# Patient Record
Sex: Male | Born: 1950
Health system: Southern US, Community
[De-identification: ages and names within clinical notes are randomized; demographics above are authoritative.]

## PROBLEM LIST (undated history)

## (undated) DIAGNOSIS — E785 Hyperlipidemia, unspecified: Secondary | ICD-10-CM

## (undated) DIAGNOSIS — J45909 Unspecified asthma, uncomplicated: Secondary | ICD-10-CM

## (undated) DIAGNOSIS — I10 Essential (primary) hypertension: Secondary | ICD-10-CM

## (undated) DIAGNOSIS — E119 Type 2 diabetes mellitus without complications: Secondary | ICD-10-CM

## (undated) DIAGNOSIS — H409 Unspecified glaucoma: Secondary | ICD-10-CM

## (undated) HISTORY — PX: KNEE SURGERY: SHX244

## (undated) HISTORY — DX: Unspecified glaucoma: H40.9

## (undated) HISTORY — DX: Type 2 diabetes mellitus without complications: E11.9

## (undated) HISTORY — DX: Essential (primary) hypertension: I10

## (undated) HISTORY — DX: Hyperlipidemia, unspecified: E78.5

## (undated) HISTORY — PX: SINUS SURGERY WITH INSTATRAK: SHX5215

## (undated) HISTORY — DX: Unspecified asthma, uncomplicated: J45.909

---

## 2013-12-04 DIAGNOSIS — M1990 Unspecified osteoarthritis, unspecified site: Secondary | ICD-10-CM | POA: Diagnosis not present

## 2013-12-04 DIAGNOSIS — IMO0001 Reserved for inherently not codable concepts without codable children: Secondary | ICD-10-CM | POA: Diagnosis not present

## 2013-12-04 DIAGNOSIS — Z79899 Other long term (current) drug therapy: Secondary | ICD-10-CM | POA: Diagnosis not present

## 2013-12-04 DIAGNOSIS — I10 Essential (primary) hypertension: Secondary | ICD-10-CM | POA: Diagnosis not present

## 2013-12-04 DIAGNOSIS — E7800 Pure hypercholesterolemia, unspecified: Secondary | ICD-10-CM | POA: Diagnosis not present

## 2013-12-05 DIAGNOSIS — B351 Tinea unguium: Secondary | ICD-10-CM | POA: Diagnosis not present

## 2013-12-05 DIAGNOSIS — E119 Type 2 diabetes mellitus without complications: Secondary | ICD-10-CM | POA: Diagnosis not present

## 2013-12-05 DIAGNOSIS — L851 Acquired keratosis [keratoderma] palmaris et plantaris: Secondary | ICD-10-CM | POA: Diagnosis not present

## 2013-12-05 DIAGNOSIS — IMO0002 Reserved for concepts with insufficient information to code with codable children: Secondary | ICD-10-CM | POA: Diagnosis not present

## 2013-12-21 DIAGNOSIS — Z1211 Encounter for screening for malignant neoplasm of colon: Secondary | ICD-10-CM | POA: Diagnosis not present

## 2013-12-21 DIAGNOSIS — E1159 Type 2 diabetes mellitus with other circulatory complications: Secondary | ICD-10-CM | POA: Diagnosis not present

## 2013-12-21 DIAGNOSIS — I10 Essential (primary) hypertension: Secondary | ICD-10-CM | POA: Diagnosis not present

## 2013-12-21 DIAGNOSIS — I798 Other disorders of arteries, arterioles and capillaries in diseases classified elsewhere: Secondary | ICD-10-CM | POA: Diagnosis not present

## 2013-12-21 DIAGNOSIS — D126 Benign neoplasm of colon, unspecified: Secondary | ICD-10-CM | POA: Diagnosis not present

## 2013-12-21 DIAGNOSIS — J45909 Unspecified asthma, uncomplicated: Secondary | ICD-10-CM | POA: Diagnosis not present

## 2013-12-25 DIAGNOSIS — R351 Nocturia: Secondary | ICD-10-CM | POA: Diagnosis not present

## 2013-12-25 DIAGNOSIS — N429 Disorder of prostate, unspecified: Secondary | ICD-10-CM | POA: Diagnosis not present

## 2013-12-25 DIAGNOSIS — N529 Male erectile dysfunction, unspecified: Secondary | ICD-10-CM | POA: Diagnosis not present

## 2014-03-13 DIAGNOSIS — N529 Male erectile dysfunction, unspecified: Secondary | ICD-10-CM | POA: Diagnosis not present

## 2014-03-13 DIAGNOSIS — IMO0001 Reserved for inherently not codable concepts without codable children: Secondary | ICD-10-CM | POA: Diagnosis not present

## 2014-03-13 DIAGNOSIS — I10 Essential (primary) hypertension: Secondary | ICD-10-CM | POA: Diagnosis not present

## 2014-03-13 DIAGNOSIS — E782 Mixed hyperlipidemia: Secondary | ICD-10-CM | POA: Diagnosis not present

## 2014-03-22 ENCOUNTER — Ambulatory Visit (INDEPENDENT_AMBULATORY_CARE_PROVIDER_SITE_OTHER): Payer: Medicare Other | Admitting: Podiatry

## 2014-03-22 ENCOUNTER — Encounter: Payer: Self-pay | Admitting: Podiatry

## 2014-03-22 VITALS — BP 78/43 | HR 42 | Resp 13 | Ht 72.0 in | Wt 248.0 lb

## 2014-03-22 DIAGNOSIS — B351 Tinea unguium: Secondary | ICD-10-CM | POA: Diagnosis not present

## 2014-03-22 DIAGNOSIS — Q828 Other specified congenital malformations of skin: Secondary | ICD-10-CM | POA: Diagnosis not present

## 2014-03-22 DIAGNOSIS — M79609 Pain in unspecified limb: Secondary | ICD-10-CM

## 2014-03-22 DIAGNOSIS — E1159 Type 2 diabetes mellitus with other circulatory complications: Secondary | ICD-10-CM | POA: Diagnosis not present

## 2014-03-22 DIAGNOSIS — M2010 Hallux valgus (acquired), unspecified foot: Secondary | ICD-10-CM | POA: Diagnosis not present

## 2014-03-22 DIAGNOSIS — M79673 Pain in unspecified foot: Secondary | ICD-10-CM

## 2014-03-22 NOTE — Progress Notes (Signed)
   Subjective:    Patient ID: Nathan Decker, male    DOB: [birth year], 63 y.o.   MRN: [id number]  HPI Comments: Pt presents for debridement of 10 toenails and B/L 2-3 MPJ and 5th MPJ plantar callouses.     Review of Systems  Endocrine:       Pt states difficulty managing the diabetes.  Genitourinary:       Hx of hospitalization for UTI  All other systems reviewed and are negative.      Objective:   Physical Exam        Assessment & Plan:

## 2014-03-22 NOTE — Progress Notes (Signed)
Subjective:     Patient ID: Nathan Decker, male   DOB: Apr 06, 1951, 63 y.o.   MRN: [id number]  HPI patient presents stating I have thick nailbeds that I cannot cut myself I did lesions on the bottom of both feet and I have a 15 year history of diabetes   Review of Systems  All other systems reviewed and are negative.      Objective:   Physical Exam  Nursing note and vitals reviewed. Constitutional: He is oriented to person, place, and time.  Cardiovascular: Intact distal pulses.   Musculoskeletal: Normal range of motion.  Neurological: He is oriented to person, place, and time.  Skin: Skin is warm and dry.   neurovascular status is found to be diminished but intact with range of motion subtalar joint midtarsal joint within normal limits and moderate diminishment of muscle strength bilateral. Patient is noted to have thick yellow nailbeds 1-5 both feet that are brittle and painful when pressed and is noted to have keratotic lesion sub-fifth metatarsal of both feet that are moderately tender when pressed especially with excessive ambulation. 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 Debris did nailbeds 1-5 both feet and lesions on both feet with no iatrogenic bleeding noted. Reappoint 3 months and less needs to be seen

## 2014-05-08 DIAGNOSIS — I10 Essential (primary) hypertension: Secondary | ICD-10-CM | POA: Diagnosis not present

## 2014-05-08 DIAGNOSIS — E782 Mixed hyperlipidemia: Secondary | ICD-10-CM | POA: Diagnosis not present

## 2014-05-08 DIAGNOSIS — E1165 Type 2 diabetes mellitus with hyperglycemia: Secondary | ICD-10-CM | POA: Diagnosis not present

## 2014-05-15 DIAGNOSIS — M47816 Spondylosis without myelopathy or radiculopathy, lumbar region: Secondary | ICD-10-CM | POA: Diagnosis not present

## 2014-05-15 DIAGNOSIS — E1165 Type 2 diabetes mellitus with hyperglycemia: Secondary | ICD-10-CM | POA: Diagnosis not present

## 2014-05-15 DIAGNOSIS — M5441 Lumbago with sciatica, right side: Secondary | ICD-10-CM | POA: Diagnosis not present

## 2014-05-15 DIAGNOSIS — E782 Mixed hyperlipidemia: Secondary | ICD-10-CM | POA: Diagnosis not present

## 2014-05-15 DIAGNOSIS — I10 Essential (primary) hypertension: Secondary | ICD-10-CM | POA: Diagnosis not present

## 2014-05-30 DIAGNOSIS — H2512 Age-related nuclear cataract, left eye: Secondary | ICD-10-CM | POA: Diagnosis not present

## 2014-05-30 DIAGNOSIS — H40033 Anatomical narrow angle, bilateral: Secondary | ICD-10-CM | POA: Diagnosis not present

## 2014-05-30 DIAGNOSIS — H524 Presbyopia: Secondary | ICD-10-CM | POA: Diagnosis not present

## 2014-05-30 DIAGNOSIS — H2511 Age-related nuclear cataract, right eye: Secondary | ICD-10-CM | POA: Diagnosis not present

## 2014-05-30 DIAGNOSIS — E119 Type 2 diabetes mellitus without complications: Secondary | ICD-10-CM | POA: Diagnosis not present

## 2014-06-11 ENCOUNTER — Ambulatory Visit (INDEPENDENT_AMBULATORY_CARE_PROVIDER_SITE_OTHER): Payer: Medicare Other | Admitting: Podiatry

## 2014-06-11 DIAGNOSIS — B351 Tinea unguium: Secondary | ICD-10-CM | POA: Diagnosis not present

## 2014-06-11 DIAGNOSIS — M79673 Pain in unspecified foot: Secondary | ICD-10-CM

## 2014-06-11 NOTE — Progress Notes (Signed)
Subjective:     Patient ID: Nathan Decker, male   DOB: [birth year], 63 y.o.   MRN: [id number]  HPI patient presents stating I have thick nailbeds that I cannot cut myself I did lesions on the bottom of both feet and I have a 15 year history of diabetes   Review of Systems  All other systems reviewed and are negative.      Objective:   Physical Exam  Nursing note and vitals reviewed. Constitutional: He is oriented to person, place, and time.  Cardiovascular: Intact distal pulses.   Musculoskeletal: Normal range of motion.  Neurological: He is oriented to person, place, and time.  Skin: Skin is warm and dry.   neurovascular status is found to be diminished but intact with range of motion subtalar joint midtarsal joint within normal limits and moderate diminishment of muscle strength bilateral. Patient is noted to have thick yellow nailbeds 1-5 both feet that are brittle and painful when pressed and is noted to have keratotic lesion sub-fifth metatarsal of both feet that are moderately tender when pressed especially with excessive ambulation. 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 Debris did nailbeds 1-5 both feet and lesions on both feet with no iatrogenic bleeding noted. Reappoint 3 months and less needs to be seen

## 2014-06-12 NOTE — Progress Notes (Signed)
Subjective:     Patient ID: Nathan Decker, male   DOB: 11/22/1950, 63 y.o.   MRN: [id number]  HPI patient continues to experience thick nails on both feet that are painful and he cannot cut himself and history of long-term diabetes   Review of Systems     Objective:   Physical Exam Neurovascular status intact with muscle strength adequate and range of motion subtalar midtarsal joint within normal limits area patient is found to have thick yellow brittle nailbeds 1-5 both feet that are painful when pressed    Assessment:     Mycotic nail infections with pain 1-5 both feet    Plan:     Debride painful nailbeds 1-5 both feet with no iatrogenic bleeding noted      

## 2014-07-03 DIAGNOSIS — H409 Unspecified glaucoma: Secondary | ICD-10-CM | POA: Diagnosis not present

## 2014-07-03 DIAGNOSIS — H4011X1 Primary open-angle glaucoma, mild stage: Secondary | ICD-10-CM | POA: Diagnosis not present

## 2014-07-03 DIAGNOSIS — H402231 Chronic angle-closure glaucoma, bilateral, mild stage: Secondary | ICD-10-CM | POA: Diagnosis not present

## 2014-08-27 ENCOUNTER — Ambulatory Visit (INDEPENDENT_AMBULATORY_CARE_PROVIDER_SITE_OTHER): Payer: Medicare Other | Admitting: Podiatry

## 2014-08-27 DIAGNOSIS — M79673 Pain in unspecified foot: Secondary | ICD-10-CM | POA: Diagnosis not present

## 2014-08-27 DIAGNOSIS — B351 Tinea unguium: Secondary | ICD-10-CM

## 2014-08-27 NOTE — Progress Notes (Signed)
Subjective:     Patient ID: Nathan Decker, male   DOB: 1951/05/04, 64 y.o.   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

## 2014-09-18 DIAGNOSIS — E782 Mixed hyperlipidemia: Secondary | ICD-10-CM | POA: Diagnosis not present

## 2014-09-18 DIAGNOSIS — E1165 Type 2 diabetes mellitus with hyperglycemia: Secondary | ICD-10-CM | POA: Diagnosis not present

## 2014-09-25 DIAGNOSIS — E1165 Type 2 diabetes mellitus with hyperglycemia: Secondary | ICD-10-CM | POA: Diagnosis not present

## 2014-09-25 DIAGNOSIS — I10 Essential (primary) hypertension: Secondary | ICD-10-CM | POA: Diagnosis not present

## 2014-09-25 DIAGNOSIS — E782 Mixed hyperlipidemia: Secondary | ICD-10-CM | POA: Diagnosis not present

## 2014-09-26 DIAGNOSIS — H409 Unspecified glaucoma: Secondary | ICD-10-CM | POA: Diagnosis not present

## 2014-09-26 DIAGNOSIS — H4011X1 Primary open-angle glaucoma, mild stage: Secondary | ICD-10-CM | POA: Diagnosis not present

## 2014-09-26 DIAGNOSIS — H402231 Chronic angle-closure glaucoma, bilateral, mild stage: Secondary | ICD-10-CM | POA: Diagnosis not present

## 2014-11-07 DIAGNOSIS — H4011X1 Primary open-angle glaucoma, mild stage: Secondary | ICD-10-CM | POA: Diagnosis not present

## 2014-11-07 DIAGNOSIS — H409 Unspecified glaucoma: Secondary | ICD-10-CM | POA: Diagnosis not present

## 2014-11-07 DIAGNOSIS — H402231 Chronic angle-closure glaucoma, bilateral, mild stage: Secondary | ICD-10-CM | POA: Diagnosis not present

## 2014-12-10 ENCOUNTER — Ambulatory Visit (INDEPENDENT_AMBULATORY_CARE_PROVIDER_SITE_OTHER): Payer: Medicare Other | Admitting: Podiatry

## 2014-12-10 ENCOUNTER — Encounter: Payer: Self-pay | Admitting: Podiatry

## 2014-12-10 VITALS — BP 178/95 | HR 76 | Resp 16

## 2014-12-10 DIAGNOSIS — M79673 Pain in unspecified foot: Secondary | ICD-10-CM

## 2014-12-10 DIAGNOSIS — B351 Tinea unguium: Secondary | ICD-10-CM

## 2014-12-12 NOTE — Progress Notes (Signed)
Subjective:     Patient ID: Nathan Decker, male   DOB: 21-Jul-1950, 64 y.o.   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

## 2014-12-25 DIAGNOSIS — E782 Mixed hyperlipidemia: Secondary | ICD-10-CM | POA: Diagnosis not present

## 2014-12-25 DIAGNOSIS — E1165 Type 2 diabetes mellitus with hyperglycemia: Secondary | ICD-10-CM | POA: Diagnosis not present

## 2015-01-01 DIAGNOSIS — M542 Cervicalgia: Secondary | ICD-10-CM | POA: Diagnosis not present

## 2015-01-01 DIAGNOSIS — E782 Mixed hyperlipidemia: Secondary | ICD-10-CM | POA: Diagnosis not present

## 2015-01-01 DIAGNOSIS — E1165 Type 2 diabetes mellitus with hyperglycemia: Secondary | ICD-10-CM | POA: Diagnosis not present

## 2015-01-01 DIAGNOSIS — J060 Acute laryngopharyngitis: Secondary | ICD-10-CM | POA: Diagnosis not present

## 2015-01-09 DIAGNOSIS — H402231 Chronic angle-closure glaucoma, bilateral, mild stage: Secondary | ICD-10-CM | POA: Diagnosis not present

## 2015-01-09 DIAGNOSIS — H4011X1 Primary open-angle glaucoma, mild stage: Secondary | ICD-10-CM | POA: Diagnosis not present

## 2015-01-24 DIAGNOSIS — H402231 Chronic angle-closure glaucoma, bilateral, mild stage: Secondary | ICD-10-CM | POA: Diagnosis not present

## 2015-01-24 DIAGNOSIS — H409 Unspecified glaucoma: Secondary | ICD-10-CM | POA: Diagnosis not present

## 2015-01-24 DIAGNOSIS — H4011X1 Primary open-angle glaucoma, mild stage: Secondary | ICD-10-CM | POA: Diagnosis not present

## 2015-04-03 DIAGNOSIS — R490 Dysphonia: Secondary | ICD-10-CM | POA: Diagnosis not present

## 2015-04-03 DIAGNOSIS — J381 Polyp of vocal cord and larynx: Secondary | ICD-10-CM | POA: Diagnosis not present

## 2015-04-22 DIAGNOSIS — J381 Polyp of vocal cord and larynx: Secondary | ICD-10-CM | POA: Diagnosis not present

## 2015-04-22 DIAGNOSIS — E785 Hyperlipidemia, unspecified: Secondary | ICD-10-CM | POA: Diagnosis not present

## 2015-04-22 DIAGNOSIS — Z888 Allergy status to other drugs, medicaments and biological substances status: Secondary | ICD-10-CM | POA: Diagnosis not present

## 2015-04-22 DIAGNOSIS — Z7984 Long term (current) use of oral hypoglycemic drugs: Secondary | ICD-10-CM | POA: Diagnosis not present

## 2015-04-22 DIAGNOSIS — I10 Essential (primary) hypertension: Secondary | ICD-10-CM | POA: Diagnosis not present

## 2015-04-22 DIAGNOSIS — J45909 Unspecified asthma, uncomplicated: Secondary | ICD-10-CM | POA: Diagnosis not present

## 2015-04-22 DIAGNOSIS — Z791 Long term (current) use of non-steroidal anti-inflammatories (NSAID): Secondary | ICD-10-CM | POA: Diagnosis not present

## 2015-04-22 DIAGNOSIS — J382 Nodules of vocal cords: Secondary | ICD-10-CM | POA: Diagnosis not present

## 2015-04-22 DIAGNOSIS — Z79899 Other long term (current) drug therapy: Secondary | ICD-10-CM | POA: Diagnosis not present

## 2015-04-22 DIAGNOSIS — M1990 Unspecified osteoarthritis, unspecified site: Secondary | ICD-10-CM | POA: Diagnosis not present

## 2015-04-22 DIAGNOSIS — E669 Obesity, unspecified: Secondary | ICD-10-CM | POA: Diagnosis not present

## 2015-04-22 DIAGNOSIS — Z87891 Personal history of nicotine dependence: Secondary | ICD-10-CM | POA: Diagnosis not present

## 2015-04-22 DIAGNOSIS — Z881 Allergy status to other antibiotic agents status: Secondary | ICD-10-CM | POA: Diagnosis not present

## 2015-04-22 DIAGNOSIS — E119 Type 2 diabetes mellitus without complications: Secondary | ICD-10-CM | POA: Diagnosis not present

## 2015-04-22 DIAGNOSIS — R490 Dysphonia: Secondary | ICD-10-CM | POA: Diagnosis not present

## 2015-04-22 DIAGNOSIS — Z7982 Long term (current) use of aspirin: Secondary | ICD-10-CM | POA: Diagnosis not present

## 2015-05-06 DIAGNOSIS — J381 Polyp of vocal cord and larynx: Secondary | ICD-10-CM | POA: Diagnosis not present

## 2015-05-29 DIAGNOSIS — E1165 Type 2 diabetes mellitus with hyperglycemia: Secondary | ICD-10-CM | POA: Diagnosis not present

## 2015-05-29 DIAGNOSIS — I10 Essential (primary) hypertension: Secondary | ICD-10-CM | POA: Diagnosis not present

## 2015-05-29 DIAGNOSIS — E782 Mixed hyperlipidemia: Secondary | ICD-10-CM | POA: Diagnosis not present

## 2015-05-29 DIAGNOSIS — N529 Male erectile dysfunction, unspecified: Secondary | ICD-10-CM | POA: Diagnosis not present

## 2015-06-03 DIAGNOSIS — E119 Type 2 diabetes mellitus without complications: Secondary | ICD-10-CM | POA: Diagnosis not present

## 2015-06-03 DIAGNOSIS — H402231 Chronic angle-closure glaucoma, bilateral, mild stage: Secondary | ICD-10-CM | POA: Diagnosis not present

## 2015-06-03 DIAGNOSIS — H401131 Primary open-angle glaucoma, bilateral, mild stage: Secondary | ICD-10-CM | POA: Diagnosis not present

## 2015-06-03 DIAGNOSIS — H25013 Cortical age-related cataract, bilateral: Secondary | ICD-10-CM | POA: Diagnosis not present

## 2015-06-05 DIAGNOSIS — I10 Essential (primary) hypertension: Secondary | ICD-10-CM | POA: Diagnosis not present

## 2015-06-05 DIAGNOSIS — Z23 Encounter for immunization: Secondary | ICD-10-CM | POA: Diagnosis not present

## 2015-06-05 DIAGNOSIS — E782 Mixed hyperlipidemia: Secondary | ICD-10-CM | POA: Diagnosis not present

## 2015-06-05 DIAGNOSIS — E1165 Type 2 diabetes mellitus with hyperglycemia: Secondary | ICD-10-CM | POA: Diagnosis not present

## 2015-06-05 DIAGNOSIS — N529 Male erectile dysfunction, unspecified: Secondary | ICD-10-CM | POA: Diagnosis not present

## 2015-06-24 DIAGNOSIS — J387 Other diseases of larynx: Secondary | ICD-10-CM | POA: Diagnosis not present

## 2015-06-24 DIAGNOSIS — R490 Dysphonia: Secondary | ICD-10-CM | POA: Diagnosis not present

## 2015-07-31 DIAGNOSIS — E1165 Type 2 diabetes mellitus with hyperglycemia: Secondary | ICD-10-CM | POA: Diagnosis not present

## 2015-07-31 DIAGNOSIS — I10 Essential (primary) hypertension: Secondary | ICD-10-CM | POA: Diagnosis not present

## 2015-07-31 DIAGNOSIS — E782 Mixed hyperlipidemia: Secondary | ICD-10-CM | POA: Diagnosis not present

## 2015-09-17 DIAGNOSIS — E1159 Type 2 diabetes mellitus with other circulatory complications: Secondary | ICD-10-CM | POA: Diagnosis not present

## 2015-09-17 DIAGNOSIS — L600 Ingrowing nail: Secondary | ICD-10-CM | POA: Diagnosis not present

## 2015-09-17 DIAGNOSIS — B351 Tinea unguium: Secondary | ICD-10-CM | POA: Diagnosis not present

## 2015-09-17 DIAGNOSIS — L84 Corns and callosities: Secondary | ICD-10-CM | POA: Diagnosis not present

## 2015-09-25 DIAGNOSIS — E1165 Type 2 diabetes mellitus with hyperglycemia: Secondary | ICD-10-CM | POA: Diagnosis not present

## 2015-09-25 DIAGNOSIS — E782 Mixed hyperlipidemia: Secondary | ICD-10-CM | POA: Diagnosis not present

## 2015-09-25 DIAGNOSIS — I10 Essential (primary) hypertension: Secondary | ICD-10-CM | POA: Diagnosis not present

## 2015-10-03 DIAGNOSIS — I10 Essential (primary) hypertension: Secondary | ICD-10-CM | POA: Diagnosis not present

## 2015-10-03 DIAGNOSIS — E782 Mixed hyperlipidemia: Secondary | ICD-10-CM | POA: Diagnosis not present

## 2015-10-03 DIAGNOSIS — E1165 Type 2 diabetes mellitus with hyperglycemia: Secondary | ICD-10-CM | POA: Diagnosis not present

## 2015-10-14 DIAGNOSIS — H402231 Chronic angle-closure glaucoma, bilateral, mild stage: Secondary | ICD-10-CM | POA: Diagnosis not present

## 2015-10-14 DIAGNOSIS — H40053 Ocular hypertension, bilateral: Secondary | ICD-10-CM | POA: Diagnosis not present

## 2015-10-14 DIAGNOSIS — H401131 Primary open-angle glaucoma, bilateral, mild stage: Secondary | ICD-10-CM | POA: Diagnosis not present

## 2015-11-07 DIAGNOSIS — R0981 Nasal congestion: Secondary | ICD-10-CM | POA: Diagnosis not present

## 2015-11-07 DIAGNOSIS — R42 Dizziness and giddiness: Secondary | ICD-10-CM | POA: Diagnosis not present

## 2016-01-15 DIAGNOSIS — E1159 Type 2 diabetes mellitus with other circulatory complications: Secondary | ICD-10-CM | POA: Diagnosis not present

## 2016-01-15 DIAGNOSIS — M2012 Hallux valgus (acquired), left foot: Secondary | ICD-10-CM | POA: Diagnosis not present

## 2016-01-30 DIAGNOSIS — M2012 Hallux valgus (acquired), left foot: Secondary | ICD-10-CM | POA: Diagnosis not present

## 2016-01-30 DIAGNOSIS — M2042 Other hammer toe(s) (acquired), left foot: Secondary | ICD-10-CM | POA: Diagnosis not present

## 2016-01-30 DIAGNOSIS — M216X2 Other acquired deformities of left foot: Secondary | ICD-10-CM | POA: Diagnosis not present

## 2016-01-30 DIAGNOSIS — E1159 Type 2 diabetes mellitus with other circulatory complications: Secondary | ICD-10-CM | POA: Diagnosis not present

## 2016-02-11 DIAGNOSIS — N529 Male erectile dysfunction, unspecified: Secondary | ICD-10-CM | POA: Diagnosis not present

## 2016-02-13 DIAGNOSIS — E7800 Pure hypercholesterolemia, unspecified: Secondary | ICD-10-CM | POA: Diagnosis not present

## 2016-02-13 DIAGNOSIS — M205X2 Other deformities of toe(s) (acquired), left foot: Secondary | ICD-10-CM | POA: Diagnosis not present

## 2016-02-13 DIAGNOSIS — M21612 Bunion of left foot: Secondary | ICD-10-CM | POA: Diagnosis not present

## 2016-02-13 DIAGNOSIS — M2012 Hallux valgus (acquired), left foot: Secondary | ICD-10-CM | POA: Diagnosis not present

## 2016-02-13 DIAGNOSIS — M2042 Other hammer toe(s) (acquired), left foot: Secondary | ICD-10-CM | POA: Diagnosis not present

## 2016-02-13 DIAGNOSIS — E1159 Type 2 diabetes mellitus with other circulatory complications: Secondary | ICD-10-CM | POA: Diagnosis not present

## 2016-02-18 DIAGNOSIS — E1159 Type 2 diabetes mellitus with other circulatory complications: Secondary | ICD-10-CM | POA: Diagnosis not present

## 2016-02-18 DIAGNOSIS — M2042 Other hammer toe(s) (acquired), left foot: Secondary | ICD-10-CM | POA: Diagnosis not present

## 2016-02-18 DIAGNOSIS — M2012 Hallux valgus (acquired), left foot: Secondary | ICD-10-CM | POA: Diagnosis not present

## 2016-03-03 ENCOUNTER — Other Ambulatory Visit: Payer: Self-pay

## 2016-03-11 DIAGNOSIS — B351 Tinea unguium: Secondary | ICD-10-CM | POA: Diagnosis not present

## 2016-03-11 DIAGNOSIS — L84 Corns and callosities: Secondary | ICD-10-CM | POA: Diagnosis not present

## 2016-03-11 DIAGNOSIS — E1159 Type 2 diabetes mellitus with other circulatory complications: Secondary | ICD-10-CM | POA: Diagnosis not present

## 2016-03-11 DIAGNOSIS — M2042 Other hammer toe(s) (acquired), left foot: Secondary | ICD-10-CM | POA: Diagnosis not present

## 2016-03-11 DIAGNOSIS — M2012 Hallux valgus (acquired), left foot: Secondary | ICD-10-CM | POA: Diagnosis not present

## 2016-03-16 DIAGNOSIS — I10 Essential (primary) hypertension: Secondary | ICD-10-CM | POA: Diagnosis not present

## 2016-03-16 DIAGNOSIS — E782 Mixed hyperlipidemia: Secondary | ICD-10-CM | POA: Diagnosis not present

## 2016-03-16 DIAGNOSIS — E1165 Type 2 diabetes mellitus with hyperglycemia: Secondary | ICD-10-CM | POA: Diagnosis not present

## 2016-03-23 DIAGNOSIS — E1165 Type 2 diabetes mellitus with hyperglycemia: Secondary | ICD-10-CM | POA: Diagnosis not present

## 2016-03-23 DIAGNOSIS — N529 Male erectile dysfunction, unspecified: Secondary | ICD-10-CM | POA: Diagnosis not present

## 2016-03-23 DIAGNOSIS — E782 Mixed hyperlipidemia: Secondary | ICD-10-CM | POA: Diagnosis not present

## 2016-03-23 DIAGNOSIS — I10 Essential (primary) hypertension: Secondary | ICD-10-CM | POA: Diagnosis not present

## 2016-04-08 DIAGNOSIS — M2042 Other hammer toe(s) (acquired), left foot: Secondary | ICD-10-CM | POA: Diagnosis not present

## 2016-04-08 DIAGNOSIS — M2012 Hallux valgus (acquired), left foot: Secondary | ICD-10-CM | POA: Diagnosis not present

## 2016-04-08 DIAGNOSIS — E1159 Type 2 diabetes mellitus with other circulatory complications: Secondary | ICD-10-CM | POA: Diagnosis not present

## 2016-04-08 DIAGNOSIS — M216X2 Other acquired deformities of left foot: Secondary | ICD-10-CM | POA: Diagnosis not present

## 2016-04-27 DIAGNOSIS — H401131 Primary open-angle glaucoma, bilateral, mild stage: Secondary | ICD-10-CM | POA: Diagnosis not present

## 2016-04-27 DIAGNOSIS — H35729 Serous detachment of retinal pigment epithelium, unspecified eye: Secondary | ICD-10-CM | POA: Diagnosis not present

## 2016-04-27 DIAGNOSIS — E119 Type 2 diabetes mellitus without complications: Secondary | ICD-10-CM | POA: Diagnosis not present

## 2016-04-27 DIAGNOSIS — H402231 Chronic angle-closure glaucoma, bilateral, mild stage: Secondary | ICD-10-CM | POA: Diagnosis not present

## 2016-04-29 DIAGNOSIS — I10 Essential (primary) hypertension: Secondary | ICD-10-CM | POA: Diagnosis not present

## 2016-04-29 DIAGNOSIS — J45909 Unspecified asthma, uncomplicated: Secondary | ICD-10-CM | POA: Diagnosis not present

## 2016-04-29 DIAGNOSIS — I4510 Unspecified right bundle-branch block: Secondary | ICD-10-CM | POA: Diagnosis not present

## 2016-04-29 DIAGNOSIS — Z87891 Personal history of nicotine dependence: Secondary | ICD-10-CM | POA: Diagnosis not present

## 2016-04-29 DIAGNOSIS — Z888 Allergy status to other drugs, medicaments and biological substances status: Secondary | ICD-10-CM | POA: Diagnosis not present

## 2016-04-29 DIAGNOSIS — Z7982 Long term (current) use of aspirin: Secondary | ICD-10-CM | POA: Diagnosis not present

## 2016-04-29 DIAGNOSIS — R079 Chest pain, unspecified: Secondary | ICD-10-CM | POA: Diagnosis not present

## 2016-04-29 DIAGNOSIS — E1165 Type 2 diabetes mellitus with hyperglycemia: Secondary | ICD-10-CM | POA: Diagnosis not present

## 2016-04-29 DIAGNOSIS — R072 Precordial pain: Secondary | ICD-10-CM | POA: Diagnosis not present

## 2016-04-29 DIAGNOSIS — Z823 Family history of stroke: Secondary | ICD-10-CM | POA: Diagnosis not present

## 2016-04-29 DIAGNOSIS — E785 Hyperlipidemia, unspecified: Secondary | ICD-10-CM | POA: Diagnosis not present

## 2016-04-29 DIAGNOSIS — Z79899 Other long term (current) drug therapy: Secondary | ICD-10-CM | POA: Diagnosis not present

## 2016-04-29 DIAGNOSIS — M1990 Unspecified osteoarthritis, unspecified site: Secondary | ICD-10-CM | POA: Diagnosis not present

## 2016-04-29 DIAGNOSIS — Z881 Allergy status to other antibiotic agents status: Secondary | ICD-10-CM | POA: Diagnosis not present

## 2016-04-29 DIAGNOSIS — Z9102 Food additives allergy status: Secondary | ICD-10-CM | POA: Diagnosis not present

## 2016-04-29 DIAGNOSIS — Z7984 Long term (current) use of oral hypoglycemic drugs: Secondary | ICD-10-CM | POA: Diagnosis not present

## 2016-04-29 DIAGNOSIS — R0789 Other chest pain: Secondary | ICD-10-CM | POA: Diagnosis not present

## 2016-04-30 DIAGNOSIS — E1165 Type 2 diabetes mellitus with hyperglycemia: Secondary | ICD-10-CM | POA: Diagnosis not present

## 2016-04-30 DIAGNOSIS — E785 Hyperlipidemia, unspecified: Secondary | ICD-10-CM | POA: Diagnosis not present

## 2016-04-30 DIAGNOSIS — R079 Chest pain, unspecified: Secondary | ICD-10-CM | POA: Diagnosis not present

## 2016-04-30 DIAGNOSIS — I10 Essential (primary) hypertension: Secondary | ICD-10-CM | POA: Diagnosis not present

## 2016-05-07 DIAGNOSIS — E1165 Type 2 diabetes mellitus with hyperglycemia: Secondary | ICD-10-CM | POA: Diagnosis not present

## 2016-05-07 DIAGNOSIS — R072 Precordial pain: Secondary | ICD-10-CM | POA: Diagnosis not present

## 2016-05-07 DIAGNOSIS — E782 Mixed hyperlipidemia: Secondary | ICD-10-CM | POA: Diagnosis not present

## 2016-05-07 DIAGNOSIS — N529 Male erectile dysfunction, unspecified: Secondary | ICD-10-CM | POA: Diagnosis not present

## 2016-05-13 DIAGNOSIS — L84 Corns and callosities: Secondary | ICD-10-CM | POA: Diagnosis not present

## 2016-05-13 DIAGNOSIS — E1159 Type 2 diabetes mellitus with other circulatory complications: Secondary | ICD-10-CM | POA: Diagnosis not present

## 2016-05-13 DIAGNOSIS — B351 Tinea unguium: Secondary | ICD-10-CM | POA: Diagnosis not present

## 2016-06-16 DIAGNOSIS — N529 Male erectile dysfunction, unspecified: Secondary | ICD-10-CM | POA: Diagnosis not present

## 2016-07-15 DIAGNOSIS — L84 Corns and callosities: Secondary | ICD-10-CM | POA: Diagnosis not present

## 2016-07-15 DIAGNOSIS — E1159 Type 2 diabetes mellitus with other circulatory complications: Secondary | ICD-10-CM | POA: Diagnosis not present

## 2016-07-15 DIAGNOSIS — B351 Tinea unguium: Secondary | ICD-10-CM | POA: Diagnosis not present

## 2016-07-20 DIAGNOSIS — E1165 Type 2 diabetes mellitus with hyperglycemia: Secondary | ICD-10-CM | POA: Diagnosis not present

## 2016-07-20 DIAGNOSIS — E782 Mixed hyperlipidemia: Secondary | ICD-10-CM | POA: Diagnosis not present

## 2016-07-20 DIAGNOSIS — Z125 Encounter for screening for malignant neoplasm of prostate: Secondary | ICD-10-CM | POA: Diagnosis not present

## 2016-07-27 DIAGNOSIS — E1165 Type 2 diabetes mellitus with hyperglycemia: Secondary | ICD-10-CM | POA: Diagnosis not present

## 2016-07-27 DIAGNOSIS — I10 Essential (primary) hypertension: Secondary | ICD-10-CM | POA: Diagnosis not present

## 2016-07-27 DIAGNOSIS — E782 Mixed hyperlipidemia: Secondary | ICD-10-CM | POA: Diagnosis not present

## 2016-08-26 DIAGNOSIS — J028 Acute pharyngitis due to other specified organisms: Secondary | ICD-10-CM | POA: Diagnosis not present

## 2016-08-26 DIAGNOSIS — R070 Pain in throat: Secondary | ICD-10-CM | POA: Diagnosis not present

## 2016-08-26 DIAGNOSIS — R0982 Postnasal drip: Secondary | ICD-10-CM | POA: Diagnosis not present

## 2016-08-26 DIAGNOSIS — J302 Other seasonal allergic rhinitis: Secondary | ICD-10-CM | POA: Diagnosis not present

## 2016-09-16 DIAGNOSIS — B351 Tinea unguium: Secondary | ICD-10-CM | POA: Diagnosis not present

## 2016-09-16 DIAGNOSIS — L84 Corns and callosities: Secondary | ICD-10-CM | POA: Diagnosis not present

## 2016-09-16 DIAGNOSIS — E1159 Type 2 diabetes mellitus with other circulatory complications: Secondary | ICD-10-CM | POA: Diagnosis not present

## 2016-10-26 DIAGNOSIS — H35711 Central serous chorioretinopathy, right eye: Secondary | ICD-10-CM | POA: Diagnosis not present

## 2016-10-26 DIAGNOSIS — E119 Type 2 diabetes mellitus without complications: Secondary | ICD-10-CM | POA: Diagnosis not present

## 2016-10-26 DIAGNOSIS — H402231 Chronic angle-closure glaucoma, bilateral, mild stage: Secondary | ICD-10-CM | POA: Diagnosis not present

## 2016-10-26 DIAGNOSIS — H401131 Primary open-angle glaucoma, bilateral, mild stage: Secondary | ICD-10-CM | POA: Diagnosis not present

## 2016-11-10 DIAGNOSIS — E1165 Type 2 diabetes mellitus with hyperglycemia: Secondary | ICD-10-CM | POA: Diagnosis not present

## 2016-11-10 DIAGNOSIS — I10 Essential (primary) hypertension: Secondary | ICD-10-CM | POA: Diagnosis not present

## 2016-11-10 DIAGNOSIS — E782 Mixed hyperlipidemia: Secondary | ICD-10-CM | POA: Diagnosis not present

## 2016-11-18 DIAGNOSIS — B351 Tinea unguium: Secondary | ICD-10-CM | POA: Diagnosis not present

## 2016-11-18 DIAGNOSIS — E1159 Type 2 diabetes mellitus with other circulatory complications: Secondary | ICD-10-CM | POA: Diagnosis not present

## 2016-11-18 DIAGNOSIS — I10 Essential (primary) hypertension: Secondary | ICD-10-CM | POA: Diagnosis not present

## 2016-11-18 DIAGNOSIS — E1165 Type 2 diabetes mellitus with hyperglycemia: Secondary | ICD-10-CM | POA: Diagnosis not present

## 2016-11-18 DIAGNOSIS — L84 Corns and callosities: Secondary | ICD-10-CM | POA: Diagnosis not present

## 2016-11-18 DIAGNOSIS — E782 Mixed hyperlipidemia: Secondary | ICD-10-CM | POA: Diagnosis not present

## 2016-12-15 DIAGNOSIS — N529 Male erectile dysfunction, unspecified: Secondary | ICD-10-CM | POA: Diagnosis not present

## 2017-01-20 DIAGNOSIS — B351 Tinea unguium: Secondary | ICD-10-CM | POA: Diagnosis not present

## 2017-01-20 DIAGNOSIS — L84 Corns and callosities: Secondary | ICD-10-CM | POA: Diagnosis not present

## 2017-01-20 DIAGNOSIS — E1159 Type 2 diabetes mellitus with other circulatory complications: Secondary | ICD-10-CM | POA: Diagnosis not present

## 2017-03-10 DIAGNOSIS — E1165 Type 2 diabetes mellitus with hyperglycemia: Secondary | ICD-10-CM | POA: Diagnosis not present

## 2017-03-17 DIAGNOSIS — E1165 Type 2 diabetes mellitus with hyperglycemia: Secondary | ICD-10-CM | POA: Diagnosis not present

## 2017-03-17 DIAGNOSIS — N529 Male erectile dysfunction, unspecified: Secondary | ICD-10-CM | POA: Diagnosis not present

## 2017-03-17 DIAGNOSIS — Z23 Encounter for immunization: Secondary | ICD-10-CM | POA: Diagnosis not present

## 2017-03-17 DIAGNOSIS — E782 Mixed hyperlipidemia: Secondary | ICD-10-CM | POA: Diagnosis not present

## 2017-03-17 DIAGNOSIS — I10 Essential (primary) hypertension: Secondary | ICD-10-CM | POA: Diagnosis not present

## 2017-03-23 DIAGNOSIS — R200 Anesthesia of skin: Secondary | ICD-10-CM | POA: Diagnosis not present

## 2017-03-23 DIAGNOSIS — I6523 Occlusion and stenosis of bilateral carotid arteries: Secondary | ICD-10-CM | POA: Diagnosis not present

## 2017-03-23 DIAGNOSIS — E785 Hyperlipidemia, unspecified: Secondary | ICD-10-CM | POA: Diagnosis present

## 2017-03-23 DIAGNOSIS — Z7984 Long term (current) use of oral hypoglycemic drugs: Secondary | ICD-10-CM | POA: Diagnosis not present

## 2017-03-23 DIAGNOSIS — I340 Nonrheumatic mitral (valve) insufficiency: Secondary | ICD-10-CM | POA: Diagnosis not present

## 2017-03-23 DIAGNOSIS — I63212 Cerebral infarction due to unspecified occlusion or stenosis of left vertebral arteries: Secondary | ICD-10-CM | POA: Diagnosis not present

## 2017-03-23 DIAGNOSIS — R05 Cough: Secondary | ICD-10-CM | POA: Diagnosis not present

## 2017-03-23 DIAGNOSIS — I10 Essential (primary) hypertension: Secondary | ICD-10-CM | POA: Diagnosis present

## 2017-03-23 DIAGNOSIS — E118 Type 2 diabetes mellitus with unspecified complications: Secondary | ICD-10-CM | POA: Diagnosis not present

## 2017-03-23 DIAGNOSIS — R402413 Glasgow coma scale score 13-15, at hospital admission: Secondary | ICD-10-CM | POA: Diagnosis present

## 2017-03-23 DIAGNOSIS — Z87891 Personal history of nicotine dependence: Secondary | ICD-10-CM | POA: Diagnosis not present

## 2017-03-23 DIAGNOSIS — H409 Unspecified glaucoma: Secondary | ICD-10-CM | POA: Diagnosis present

## 2017-03-23 DIAGNOSIS — R202 Paresthesia of skin: Secondary | ICD-10-CM | POA: Diagnosis not present

## 2017-03-23 DIAGNOSIS — I639 Cerebral infarction, unspecified: Secondary | ICD-10-CM | POA: Diagnosis not present

## 2017-03-23 DIAGNOSIS — I6322 Cerebral infarction due to unspecified occlusion or stenosis of basilar arteries: Secondary | ICD-10-CM | POA: Diagnosis not present

## 2017-03-30 DIAGNOSIS — I10 Essential (primary) hypertension: Secondary | ICD-10-CM | POA: Diagnosis not present

## 2017-03-30 DIAGNOSIS — E782 Mixed hyperlipidemia: Secondary | ICD-10-CM | POA: Diagnosis not present

## 2017-03-30 DIAGNOSIS — I639 Cerebral infarction, unspecified: Secondary | ICD-10-CM | POA: Diagnosis not present

## 2017-03-30 DIAGNOSIS — E1165 Type 2 diabetes mellitus with hyperglycemia: Secondary | ICD-10-CM | POA: Diagnosis not present

## 2017-04-01 DIAGNOSIS — I6340 Cerebral infarction due to embolism of unspecified cerebral artery: Secondary | ICD-10-CM | POA: Diagnosis not present

## 2017-04-01 DIAGNOSIS — E782 Mixed hyperlipidemia: Secondary | ICD-10-CM | POA: Diagnosis not present

## 2017-04-01 DIAGNOSIS — E1165 Type 2 diabetes mellitus with hyperglycemia: Secondary | ICD-10-CM | POA: Diagnosis not present

## 2017-04-01 DIAGNOSIS — I69351 Hemiplegia and hemiparesis following cerebral infarction affecting right dominant side: Secondary | ICD-10-CM | POA: Diagnosis not present

## 2017-04-01 DIAGNOSIS — E1159 Type 2 diabetes mellitus with other circulatory complications: Secondary | ICD-10-CM | POA: Diagnosis not present

## 2017-04-06 DIAGNOSIS — E782 Mixed hyperlipidemia: Secondary | ICD-10-CM | POA: Diagnosis not present

## 2017-04-06 DIAGNOSIS — I63412 Cerebral infarction due to embolism of left middle cerebral artery: Secondary | ICD-10-CM | POA: Diagnosis not present

## 2017-04-06 DIAGNOSIS — E1165 Type 2 diabetes mellitus with hyperglycemia: Secondary | ICD-10-CM | POA: Diagnosis not present

## 2017-04-06 DIAGNOSIS — I69351 Hemiplegia and hemiparesis following cerebral infarction affecting right dominant side: Secondary | ICD-10-CM | POA: Diagnosis not present

## 2017-05-03 DIAGNOSIS — B351 Tinea unguium: Secondary | ICD-10-CM | POA: Diagnosis not present

## 2017-05-03 DIAGNOSIS — E1159 Type 2 diabetes mellitus with other circulatory complications: Secondary | ICD-10-CM | POA: Diagnosis not present

## 2017-05-03 DIAGNOSIS — L84 Corns and callosities: Secondary | ICD-10-CM | POA: Diagnosis not present

## 2017-05-05 DIAGNOSIS — H402231 Chronic angle-closure glaucoma, bilateral, mild stage: Secondary | ICD-10-CM | POA: Diagnosis not present

## 2017-05-05 DIAGNOSIS — E119 Type 2 diabetes mellitus without complications: Secondary | ICD-10-CM | POA: Diagnosis not present

## 2017-05-05 DIAGNOSIS — H25013 Cortical age-related cataract, bilateral: Secondary | ICD-10-CM | POA: Diagnosis not present

## 2017-05-05 DIAGNOSIS — H401131 Primary open-angle glaucoma, bilateral, mild stage: Secondary | ICD-10-CM | POA: Diagnosis not present

## 2017-05-07 DIAGNOSIS — E782 Mixed hyperlipidemia: Secondary | ICD-10-CM | POA: Diagnosis not present

## 2017-05-07 DIAGNOSIS — I63412 Cerebral infarction due to embolism of left middle cerebral artery: Secondary | ICD-10-CM | POA: Diagnosis not present

## 2017-05-07 DIAGNOSIS — E1165 Type 2 diabetes mellitus with hyperglycemia: Secondary | ICD-10-CM | POA: Diagnosis not present

## 2017-05-07 DIAGNOSIS — Z23 Encounter for immunization: Secondary | ICD-10-CM | POA: Diagnosis not present

## 2017-05-07 DIAGNOSIS — E1159 Type 2 diabetes mellitus with other circulatory complications: Secondary | ICD-10-CM | POA: Diagnosis not present

## 2017-07-05 DIAGNOSIS — B351 Tinea unguium: Secondary | ICD-10-CM | POA: Diagnosis not present

## 2017-07-05 DIAGNOSIS — L84 Corns and callosities: Secondary | ICD-10-CM | POA: Diagnosis not present

## 2017-07-05 DIAGNOSIS — E1159 Type 2 diabetes mellitus with other circulatory complications: Secondary | ICD-10-CM | POA: Diagnosis not present

## 2017-07-13 DIAGNOSIS — Z0000 Encounter for general adult medical examination without abnormal findings: Secondary | ICD-10-CM | POA: Diagnosis not present

## 2017-07-13 DIAGNOSIS — E782 Mixed hyperlipidemia: Secondary | ICD-10-CM | POA: Diagnosis not present

## 2017-07-13 DIAGNOSIS — Z125 Encounter for screening for malignant neoplasm of prostate: Secondary | ICD-10-CM | POA: Diagnosis not present

## 2017-07-13 DIAGNOSIS — I10 Essential (primary) hypertension: Secondary | ICD-10-CM | POA: Diagnosis not present

## 2017-07-13 DIAGNOSIS — E1165 Type 2 diabetes mellitus with hyperglycemia: Secondary | ICD-10-CM | POA: Diagnosis not present

## 2017-07-20 DIAGNOSIS — E1159 Type 2 diabetes mellitus with other circulatory complications: Secondary | ICD-10-CM | POA: Diagnosis not present

## 2017-07-20 DIAGNOSIS — I63412 Cerebral infarction due to embolism of left middle cerebral artery: Secondary | ICD-10-CM | POA: Diagnosis not present

## 2017-07-20 DIAGNOSIS — E782 Mixed hyperlipidemia: Secondary | ICD-10-CM | POA: Diagnosis not present

## 2017-07-20 DIAGNOSIS — N529 Male erectile dysfunction, unspecified: Secondary | ICD-10-CM | POA: Diagnosis not present

## 2017-07-20 DIAGNOSIS — I69351 Hemiplegia and hemiparesis following cerebral infarction affecting right dominant side: Secondary | ICD-10-CM | POA: Diagnosis not present

## 2017-07-20 DIAGNOSIS — E1165 Type 2 diabetes mellitus with hyperglycemia: Secondary | ICD-10-CM | POA: Diagnosis not present

## 2017-07-20 DIAGNOSIS — I10 Essential (primary) hypertension: Secondary | ICD-10-CM | POA: Diagnosis not present

## 2017-08-09 DIAGNOSIS — Z125 Encounter for screening for malignant neoplasm of prostate: Secondary | ICD-10-CM | POA: Diagnosis not present

## 2017-08-09 DIAGNOSIS — N5201 Erectile dysfunction due to arterial insufficiency: Secondary | ICD-10-CM | POA: Diagnosis not present

## 2017-09-15 DIAGNOSIS — E1159 Type 2 diabetes mellitus with other circulatory complications: Secondary | ICD-10-CM | POA: Diagnosis not present

## 2017-09-15 DIAGNOSIS — L84 Corns and callosities: Secondary | ICD-10-CM | POA: Diagnosis not present

## 2017-09-15 DIAGNOSIS — B351 Tinea unguium: Secondary | ICD-10-CM | POA: Diagnosis not present

## 2017-11-09 DIAGNOSIS — N5201 Erectile dysfunction due to arterial insufficiency: Secondary | ICD-10-CM | POA: Diagnosis not present

## 2017-11-17 DIAGNOSIS — L84 Corns and callosities: Secondary | ICD-10-CM | POA: Diagnosis not present

## 2017-11-17 DIAGNOSIS — E1159 Type 2 diabetes mellitus with other circulatory complications: Secondary | ICD-10-CM | POA: Diagnosis not present

## 2017-11-17 DIAGNOSIS — B351 Tinea unguium: Secondary | ICD-10-CM | POA: Diagnosis not present

## 2017-11-23 DIAGNOSIS — H402231 Chronic angle-closure glaucoma, bilateral, mild stage: Secondary | ICD-10-CM | POA: Diagnosis not present

## 2017-11-23 DIAGNOSIS — H401131 Primary open-angle glaucoma, bilateral, mild stage: Secondary | ICD-10-CM | POA: Diagnosis not present

## 2017-11-25 DIAGNOSIS — N481 Balanitis: Secondary | ICD-10-CM | POA: Diagnosis not present

## 2017-12-02 DIAGNOSIS — I63412 Cerebral infarction due to embolism of left middle cerebral artery: Secondary | ICD-10-CM | POA: Diagnosis not present

## 2017-12-02 DIAGNOSIS — R202 Paresthesia of skin: Secondary | ICD-10-CM | POA: Diagnosis not present

## 2017-12-17 DIAGNOSIS — E782 Mixed hyperlipidemia: Secondary | ICD-10-CM | POA: Diagnosis not present

## 2017-12-17 DIAGNOSIS — E1165 Type 2 diabetes mellitus with hyperglycemia: Secondary | ICD-10-CM | POA: Diagnosis not present

## 2017-12-17 DIAGNOSIS — I63412 Cerebral infarction due to embolism of left middle cerebral artery: Secondary | ICD-10-CM | POA: Diagnosis not present

## 2017-12-28 DIAGNOSIS — E1165 Type 2 diabetes mellitus with hyperglycemia: Secondary | ICD-10-CM | POA: Diagnosis not present

## 2017-12-28 DIAGNOSIS — E1159 Type 2 diabetes mellitus with other circulatory complications: Secondary | ICD-10-CM | POA: Diagnosis not present

## 2017-12-28 DIAGNOSIS — E782 Mixed hyperlipidemia: Secondary | ICD-10-CM | POA: Diagnosis not present

## 2017-12-28 DIAGNOSIS — I69351 Hemiplegia and hemiparesis following cerebral infarction affecting right dominant side: Secondary | ICD-10-CM | POA: Diagnosis not present

## 2017-12-28 DIAGNOSIS — I63412 Cerebral infarction due to embolism of left middle cerebral artery: Secondary | ICD-10-CM | POA: Diagnosis not present

## 2018-01-19 DIAGNOSIS — E1159 Type 2 diabetes mellitus with other circulatory complications: Secondary | ICD-10-CM | POA: Diagnosis not present

## 2018-01-19 DIAGNOSIS — B351 Tinea unguium: Secondary | ICD-10-CM | POA: Diagnosis not present

## 2018-01-19 DIAGNOSIS — L84 Corns and callosities: Secondary | ICD-10-CM | POA: Diagnosis not present

## 2018-01-25 DIAGNOSIS — M792 Neuralgia and neuritis, unspecified: Secondary | ICD-10-CM | POA: Diagnosis not present

## 2018-01-25 DIAGNOSIS — E782 Mixed hyperlipidemia: Secondary | ICD-10-CM | POA: Diagnosis not present

## 2018-01-25 DIAGNOSIS — E1165 Type 2 diabetes mellitus with hyperglycemia: Secondary | ICD-10-CM | POA: Diagnosis not present

## 2018-01-25 DIAGNOSIS — I69351 Hemiplegia and hemiparesis following cerebral infarction affecting right dominant side: Secondary | ICD-10-CM | POA: Diagnosis not present

## 2018-01-25 DIAGNOSIS — I10 Essential (primary) hypertension: Secondary | ICD-10-CM | POA: Diagnosis not present

## 2018-02-24 DIAGNOSIS — G890 Central pain syndrome: Secondary | ICD-10-CM | POA: Diagnosis not present

## 2018-03-15 DIAGNOSIS — J069 Acute upper respiratory infection, unspecified: Secondary | ICD-10-CM | POA: Diagnosis not present

## 2018-03-15 DIAGNOSIS — B9789 Other viral agents as the cause of diseases classified elsewhere: Secondary | ICD-10-CM | POA: Diagnosis not present

## 2018-03-21 DIAGNOSIS — I491 Atrial premature depolarization: Secondary | ICD-10-CM | POA: Diagnosis not present

## 2018-03-21 DIAGNOSIS — J209 Acute bronchitis, unspecified: Secondary | ICD-10-CM | POA: Diagnosis not present

## 2018-03-21 DIAGNOSIS — J9801 Acute bronchospasm: Secondary | ICD-10-CM | POA: Diagnosis not present

## 2018-03-21 DIAGNOSIS — R0602 Shortness of breath: Secondary | ICD-10-CM | POA: Diagnosis not present

## 2018-03-21 DIAGNOSIS — I4510 Unspecified right bundle-branch block: Secondary | ICD-10-CM | POA: Diagnosis not present

## 2018-03-22 DIAGNOSIS — E1165 Type 2 diabetes mellitus with hyperglycemia: Secondary | ICD-10-CM | POA: Diagnosis not present

## 2018-03-22 DIAGNOSIS — I10 Essential (primary) hypertension: Secondary | ICD-10-CM | POA: Diagnosis not present

## 2018-03-22 DIAGNOSIS — E782 Mixed hyperlipidemia: Secondary | ICD-10-CM | POA: Diagnosis not present

## 2018-03-29 DIAGNOSIS — I69351 Hemiplegia and hemiparesis following cerebral infarction affecting right dominant side: Secondary | ICD-10-CM | POA: Diagnosis not present

## 2018-03-29 DIAGNOSIS — I63412 Cerebral infarction due to embolism of left middle cerebral artery: Secondary | ICD-10-CM | POA: Diagnosis not present

## 2018-03-29 DIAGNOSIS — M792 Neuralgia and neuritis, unspecified: Secondary | ICD-10-CM | POA: Diagnosis not present

## 2018-03-29 DIAGNOSIS — E1165 Type 2 diabetes mellitus with hyperglycemia: Secondary | ICD-10-CM | POA: Diagnosis not present

## 2018-03-29 DIAGNOSIS — I10 Essential (primary) hypertension: Secondary | ICD-10-CM | POA: Diagnosis not present

## 2018-03-29 DIAGNOSIS — Z23 Encounter for immunization: Secondary | ICD-10-CM | POA: Diagnosis not present

## 2018-03-29 DIAGNOSIS — R202 Paresthesia of skin: Secondary | ICD-10-CM | POA: Diagnosis not present

## 2018-03-30 DIAGNOSIS — E1159 Type 2 diabetes mellitus with other circulatory complications: Secondary | ICD-10-CM | POA: Diagnosis not present

## 2018-03-30 DIAGNOSIS — L84 Corns and callosities: Secondary | ICD-10-CM | POA: Diagnosis not present

## 2018-03-30 DIAGNOSIS — B351 Tinea unguium: Secondary | ICD-10-CM | POA: Diagnosis not present

## 2018-04-05 DIAGNOSIS — Z1331 Encounter for screening for depression: Secondary | ICD-10-CM | POA: Diagnosis not present

## 2018-04-05 DIAGNOSIS — R200 Anesthesia of skin: Secondary | ICD-10-CM | POA: Diagnosis not present

## 2018-04-05 DIAGNOSIS — G8929 Other chronic pain: Secondary | ICD-10-CM | POA: Diagnosis not present

## 2018-04-05 DIAGNOSIS — Z79899 Other long term (current) drug therapy: Secondary | ICD-10-CM | POA: Diagnosis not present

## 2018-04-05 DIAGNOSIS — M25562 Pain in left knee: Secondary | ICD-10-CM | POA: Diagnosis not present

## 2018-04-05 DIAGNOSIS — Z1339 Encounter for screening examination for other mental health and behavioral disorders: Secondary | ICD-10-CM | POA: Diagnosis not present

## 2018-04-05 DIAGNOSIS — M25561 Pain in right knee: Secondary | ICD-10-CM | POA: Diagnosis not present

## 2018-05-03 DIAGNOSIS — M25561 Pain in right knee: Secondary | ICD-10-CM | POA: Diagnosis not present

## 2018-05-03 DIAGNOSIS — G8929 Other chronic pain: Secondary | ICD-10-CM | POA: Diagnosis not present

## 2018-05-03 DIAGNOSIS — M25562 Pain in left knee: Secondary | ICD-10-CM | POA: Diagnosis not present

## 2018-05-03 DIAGNOSIS — R200 Anesthesia of skin: Secondary | ICD-10-CM | POA: Diagnosis not present

## 2018-05-03 DIAGNOSIS — Z79899 Other long term (current) drug therapy: Secondary | ICD-10-CM | POA: Diagnosis not present

## 2018-05-10 DIAGNOSIS — Z125 Encounter for screening for malignant neoplasm of prostate: Secondary | ICD-10-CM | POA: Diagnosis not present

## 2018-05-17 DIAGNOSIS — N5201 Erectile dysfunction due to arterial insufficiency: Secondary | ICD-10-CM | POA: Diagnosis not present

## 2018-05-25 DIAGNOSIS — M1711 Unilateral primary osteoarthritis, right knee: Secondary | ICD-10-CM | POA: Diagnosis not present

## 2018-05-25 DIAGNOSIS — M25561 Pain in right knee: Secondary | ICD-10-CM | POA: Diagnosis not present

## 2018-05-25 DIAGNOSIS — M1712 Unilateral primary osteoarthritis, left knee: Secondary | ICD-10-CM | POA: Diagnosis not present

## 2018-05-25 DIAGNOSIS — M25562 Pain in left knee: Secondary | ICD-10-CM | POA: Diagnosis not present

## 2018-05-31 DIAGNOSIS — E119 Type 2 diabetes mellitus without complications: Secondary | ICD-10-CM | POA: Diagnosis not present

## 2018-05-31 DIAGNOSIS — H402231 Chronic angle-closure glaucoma, bilateral, mild stage: Secondary | ICD-10-CM | POA: Diagnosis not present

## 2018-05-31 DIAGNOSIS — H401131 Primary open-angle glaucoma, bilateral, mild stage: Secondary | ICD-10-CM | POA: Diagnosis not present

## 2018-05-31 DIAGNOSIS — H25013 Cortical age-related cataract, bilateral: Secondary | ICD-10-CM | POA: Diagnosis not present

## 2018-06-01 DIAGNOSIS — E1159 Type 2 diabetes mellitus with other circulatory complications: Secondary | ICD-10-CM | POA: Diagnosis not present

## 2018-06-01 DIAGNOSIS — L84 Corns and callosities: Secondary | ICD-10-CM | POA: Diagnosis not present

## 2018-06-01 DIAGNOSIS — B351 Tinea unguium: Secondary | ICD-10-CM | POA: Diagnosis not present

## 2018-06-08 DIAGNOSIS — M1711 Unilateral primary osteoarthritis, right knee: Secondary | ICD-10-CM | POA: Diagnosis not present

## 2018-08-02 DIAGNOSIS — I69351 Hemiplegia and hemiparesis following cerebral infarction affecting right dominant side: Secondary | ICD-10-CM | POA: Diagnosis not present

## 2018-08-02 DIAGNOSIS — E1165 Type 2 diabetes mellitus with hyperglycemia: Secondary | ICD-10-CM | POA: Diagnosis not present

## 2018-08-02 DIAGNOSIS — I10 Essential (primary) hypertension: Secondary | ICD-10-CM | POA: Diagnosis not present

## 2018-08-02 DIAGNOSIS — Z0000 Encounter for general adult medical examination without abnormal findings: Secondary | ICD-10-CM | POA: Diagnosis not present

## 2018-08-02 DIAGNOSIS — I63412 Cerebral infarction due to embolism of left middle cerebral artery: Secondary | ICD-10-CM | POA: Diagnosis not present

## 2018-08-03 DIAGNOSIS — L84 Corns and callosities: Secondary | ICD-10-CM | POA: Diagnosis not present

## 2018-08-03 DIAGNOSIS — E1159 Type 2 diabetes mellitus with other circulatory complications: Secondary | ICD-10-CM | POA: Diagnosis not present

## 2018-08-03 DIAGNOSIS — B351 Tinea unguium: Secondary | ICD-10-CM | POA: Diagnosis not present

## 2018-08-09 DIAGNOSIS — I63412 Cerebral infarction due to embolism of left middle cerebral artery: Secondary | ICD-10-CM | POA: Diagnosis not present

## 2018-08-09 DIAGNOSIS — E1159 Type 2 diabetes mellitus with other circulatory complications: Secondary | ICD-10-CM | POA: Diagnosis not present

## 2018-08-09 DIAGNOSIS — E1165 Type 2 diabetes mellitus with hyperglycemia: Secondary | ICD-10-CM | POA: Diagnosis not present

## 2018-08-09 DIAGNOSIS — Z0000 Encounter for general adult medical examination without abnormal findings: Secondary | ICD-10-CM | POA: Diagnosis not present

## 2018-08-11 DIAGNOSIS — R112 Nausea with vomiting, unspecified: Secondary | ICD-10-CM | POA: Diagnosis not present

## 2018-08-11 DIAGNOSIS — Z7984 Long term (current) use of oral hypoglycemic drugs: Secondary | ICD-10-CM | POA: Diagnosis not present

## 2018-08-11 DIAGNOSIS — R110 Nausea: Secondary | ICD-10-CM | POA: Diagnosis not present

## 2018-08-11 DIAGNOSIS — Z888 Allergy status to other drugs, medicaments and biological substances status: Secondary | ICD-10-CM | POA: Diagnosis not present

## 2018-08-11 DIAGNOSIS — E11618 Type 2 diabetes mellitus with other diabetic arthropathy: Secondary | ICD-10-CM | POA: Diagnosis not present

## 2018-08-11 DIAGNOSIS — Z881 Allergy status to other antibiotic agents status: Secondary | ICD-10-CM | POA: Diagnosis not present

## 2018-08-11 DIAGNOSIS — Z79899 Other long term (current) drug therapy: Secondary | ICD-10-CM | POA: Diagnosis not present

## 2018-08-11 DIAGNOSIS — R1111 Vomiting without nausea: Secondary | ICD-10-CM | POA: Diagnosis not present

## 2018-08-11 DIAGNOSIS — I4510 Unspecified right bundle-branch block: Secondary | ICD-10-CM | POA: Diagnosis not present

## 2018-08-11 DIAGNOSIS — E785 Hyperlipidemia, unspecified: Secondary | ICD-10-CM | POA: Diagnosis not present

## 2018-08-11 DIAGNOSIS — Z8673 Personal history of transient ischemic attack (TIA), and cerebral infarction without residual deficits: Secondary | ICD-10-CM | POA: Diagnosis not present

## 2018-08-11 DIAGNOSIS — I10 Essential (primary) hypertension: Secondary | ICD-10-CM | POA: Diagnosis not present

## 2018-08-11 DIAGNOSIS — Z7982 Long term (current) use of aspirin: Secondary | ICD-10-CM | POA: Diagnosis not present

## 2018-08-11 DIAGNOSIS — J45909 Unspecified asthma, uncomplicated: Secondary | ICD-10-CM | POA: Diagnosis not present

## 2018-08-11 DIAGNOSIS — K2900 Acute gastritis without bleeding: Secondary | ICD-10-CM | POA: Diagnosis not present

## 2018-08-11 DIAGNOSIS — Z87891 Personal history of nicotine dependence: Secondary | ICD-10-CM | POA: Diagnosis not present

## 2018-08-16 DIAGNOSIS — I69351 Hemiplegia and hemiparesis following cerebral infarction affecting right dominant side: Secondary | ICD-10-CM | POA: Diagnosis not present

## 2018-08-16 DIAGNOSIS — E1165 Type 2 diabetes mellitus with hyperglycemia: Secondary | ICD-10-CM | POA: Diagnosis not present

## 2018-08-16 DIAGNOSIS — E782 Mixed hyperlipidemia: Secondary | ICD-10-CM | POA: Diagnosis not present

## 2018-08-16 DIAGNOSIS — E118 Type 2 diabetes mellitus with unspecified complications: Secondary | ICD-10-CM | POA: Diagnosis not present

## 2018-08-16 DIAGNOSIS — I10 Essential (primary) hypertension: Secondary | ICD-10-CM | POA: Diagnosis not present

## 2018-08-30 DIAGNOSIS — E1165 Type 2 diabetes mellitus with hyperglycemia: Secondary | ICD-10-CM | POA: Diagnosis not present

## 2018-08-30 DIAGNOSIS — E118 Type 2 diabetes mellitus with unspecified complications: Secondary | ICD-10-CM | POA: Diagnosis not present

## 2018-08-30 DIAGNOSIS — I69351 Hemiplegia and hemiparesis following cerebral infarction affecting right dominant side: Secondary | ICD-10-CM | POA: Diagnosis not present

## 2018-08-30 DIAGNOSIS — E782 Mixed hyperlipidemia: Secondary | ICD-10-CM | POA: Diagnosis not present

## 2018-08-30 DIAGNOSIS — I10 Essential (primary) hypertension: Secondary | ICD-10-CM | POA: Diagnosis not present

## 2018-09-13 DIAGNOSIS — E1165 Type 2 diabetes mellitus with hyperglycemia: Secondary | ICD-10-CM | POA: Diagnosis not present

## 2019-01-24 DIAGNOSIS — E1165 Type 2 diabetes mellitus with hyperglycemia: Secondary | ICD-10-CM | POA: Diagnosis not present

## 2019-01-24 DIAGNOSIS — Z7189 Other specified counseling: Secondary | ICD-10-CM | POA: Diagnosis not present

## 2019-01-24 DIAGNOSIS — I63412 Cerebral infarction due to embolism of left middle cerebral artery: Secondary | ICD-10-CM | POA: Diagnosis not present

## 2019-01-24 DIAGNOSIS — I69351 Hemiplegia and hemiparesis following cerebral infarction affecting right dominant side: Secondary | ICD-10-CM | POA: Diagnosis not present

## 2019-01-24 DIAGNOSIS — F5101 Primary insomnia: Secondary | ICD-10-CM | POA: Diagnosis not present

## 2019-01-26 DIAGNOSIS — E1165 Type 2 diabetes mellitus with hyperglycemia: Secondary | ICD-10-CM | POA: Diagnosis not present

## 2019-01-26 DIAGNOSIS — E782 Mixed hyperlipidemia: Secondary | ICD-10-CM | POA: Diagnosis not present

## 2019-01-26 DIAGNOSIS — E118 Type 2 diabetes mellitus with unspecified complications: Secondary | ICD-10-CM | POA: Diagnosis not present

## 2019-01-26 DIAGNOSIS — I10 Essential (primary) hypertension: Secondary | ICD-10-CM | POA: Diagnosis not present

## 2019-01-26 DIAGNOSIS — I69351 Hemiplegia and hemiparesis following cerebral infarction affecting right dominant side: Secondary | ICD-10-CM | POA: Diagnosis not present

## 2019-02-21 DIAGNOSIS — E1165 Type 2 diabetes mellitus with hyperglycemia: Secondary | ICD-10-CM | POA: Diagnosis not present

## 2019-02-21 DIAGNOSIS — I69351 Hemiplegia and hemiparesis following cerebral infarction affecting right dominant side: Secondary | ICD-10-CM | POA: Diagnosis not present

## 2019-02-21 DIAGNOSIS — E782 Mixed hyperlipidemia: Secondary | ICD-10-CM | POA: Diagnosis not present

## 2019-02-21 DIAGNOSIS — H401131 Primary open-angle glaucoma, bilateral, mild stage: Secondary | ICD-10-CM | POA: Diagnosis not present

## 2019-02-23 DIAGNOSIS — Z8673 Personal history of transient ischemic attack (TIA), and cerebral infarction without residual deficits: Secondary | ICD-10-CM | POA: Diagnosis not present

## 2019-02-23 DIAGNOSIS — E1165 Type 2 diabetes mellitus with hyperglycemia: Secondary | ICD-10-CM | POA: Diagnosis not present

## 2019-02-23 DIAGNOSIS — R1084 Generalized abdominal pain: Secondary | ICD-10-CM | POA: Diagnosis not present

## 2019-02-23 DIAGNOSIS — R51 Headache: Secondary | ICD-10-CM | POA: Diagnosis not present

## 2019-02-28 DIAGNOSIS — N182 Chronic kidney disease, stage 2 (mild): Secondary | ICD-10-CM | POA: Diagnosis not present

## 2019-02-28 DIAGNOSIS — I63412 Cerebral infarction due to embolism of left middle cerebral artery: Secondary | ICD-10-CM | POA: Diagnosis not present

## 2019-02-28 DIAGNOSIS — Z23 Encounter for immunization: Secondary | ICD-10-CM | POA: Diagnosis not present

## 2019-02-28 DIAGNOSIS — E1165 Type 2 diabetes mellitus with hyperglycemia: Secondary | ICD-10-CM | POA: Diagnosis not present

## 2019-02-28 DIAGNOSIS — I69351 Hemiplegia and hemiparesis following cerebral infarction affecting right dominant side: Secondary | ICD-10-CM | POA: Diagnosis not present

## 2019-02-28 DIAGNOSIS — I129 Hypertensive chronic kidney disease with stage 1 through stage 4 chronic kidney disease, or unspecified chronic kidney disease: Secondary | ICD-10-CM | POA: Diagnosis not present

## 2019-04-17 DIAGNOSIS — L84 Corns and callosities: Secondary | ICD-10-CM | POA: Diagnosis not present

## 2019-04-17 DIAGNOSIS — B351 Tinea unguium: Secondary | ICD-10-CM | POA: Diagnosis not present

## 2019-04-17 DIAGNOSIS — E1159 Type 2 diabetes mellitus with other circulatory complications: Secondary | ICD-10-CM | POA: Diagnosis not present

## 2019-05-30 DIAGNOSIS — E782 Mixed hyperlipidemia: Secondary | ICD-10-CM | POA: Diagnosis not present

## 2019-05-30 DIAGNOSIS — I10 Essential (primary) hypertension: Secondary | ICD-10-CM | POA: Diagnosis not present

## 2019-05-30 DIAGNOSIS — E1165 Type 2 diabetes mellitus with hyperglycemia: Secondary | ICD-10-CM | POA: Diagnosis not present

## 2019-05-30 DIAGNOSIS — I69351 Hemiplegia and hemiparesis following cerebral infarction affecting right dominant side: Secondary | ICD-10-CM | POA: Diagnosis not present

## 2019-06-13 DIAGNOSIS — N182 Chronic kidney disease, stage 2 (mild): Secondary | ICD-10-CM | POA: Diagnosis not present

## 2019-06-13 DIAGNOSIS — I129 Hypertensive chronic kidney disease with stage 1 through stage 4 chronic kidney disease, or unspecified chronic kidney disease: Secondary | ICD-10-CM | POA: Diagnosis not present

## 2019-06-20 DIAGNOSIS — E1159 Type 2 diabetes mellitus with other circulatory complications: Secondary | ICD-10-CM | POA: Diagnosis not present

## 2019-06-20 DIAGNOSIS — E782 Mixed hyperlipidemia: Secondary | ICD-10-CM | POA: Diagnosis not present

## 2019-06-20 DIAGNOSIS — E1165 Type 2 diabetes mellitus with hyperglycemia: Secondary | ICD-10-CM | POA: Diagnosis not present

## 2019-06-20 DIAGNOSIS — I63412 Cerebral infarction due to embolism of left middle cerebral artery: Secondary | ICD-10-CM | POA: Diagnosis not present

## 2019-06-20 DIAGNOSIS — I69351 Hemiplegia and hemiparesis following cerebral infarction affecting right dominant side: Secondary | ICD-10-CM | POA: Diagnosis not present

## 2019-07-17 DIAGNOSIS — B351 Tinea unguium: Secondary | ICD-10-CM | POA: Diagnosis not present

## 2019-07-17 DIAGNOSIS — L84 Corns and callosities: Secondary | ICD-10-CM | POA: Diagnosis not present

## 2019-07-17 DIAGNOSIS — E1159 Type 2 diabetes mellitus with other circulatory complications: Secondary | ICD-10-CM | POA: Diagnosis not present

## 2019-07-18 DIAGNOSIS — H401131 Primary open-angle glaucoma, bilateral, mild stage: Secondary | ICD-10-CM | POA: Diagnosis not present

## 2019-07-18 DIAGNOSIS — L723 Sebaceous cyst: Secondary | ICD-10-CM | POA: Diagnosis not present

## 2019-08-11 DIAGNOSIS — L0100 Impetigo, unspecified: Secondary | ICD-10-CM | POA: Diagnosis not present

## 2019-08-11 DIAGNOSIS — L578 Other skin changes due to chronic exposure to nonionizing radiation: Secondary | ICD-10-CM | POA: Diagnosis not present

## 2019-08-11 DIAGNOSIS — L720 Epidermal cyst: Secondary | ICD-10-CM | POA: Diagnosis not present

## 2019-08-28 DIAGNOSIS — H401131 Primary open-angle glaucoma, bilateral, mild stage: Secondary | ICD-10-CM | POA: Diagnosis not present

## 2019-09-20 DIAGNOSIS — I69351 Hemiplegia and hemiparesis following cerebral infarction affecting right dominant side: Secondary | ICD-10-CM | POA: Diagnosis not present

## 2019-09-20 DIAGNOSIS — R202 Paresthesia of skin: Secondary | ICD-10-CM | POA: Diagnosis not present

## 2019-09-20 DIAGNOSIS — M792 Neuralgia and neuritis, unspecified: Secondary | ICD-10-CM | POA: Diagnosis not present

## 2019-09-20 DIAGNOSIS — E1165 Type 2 diabetes mellitus with hyperglycemia: Secondary | ICD-10-CM | POA: Diagnosis not present

## 2019-09-20 DIAGNOSIS — E1159 Type 2 diabetes mellitus with other circulatory complications: Secondary | ICD-10-CM | POA: Diagnosis not present

## 2019-09-20 DIAGNOSIS — E782 Mixed hyperlipidemia: Secondary | ICD-10-CM | POA: Diagnosis not present

## 2019-09-20 DIAGNOSIS — I63412 Cerebral infarction due to embolism of left middle cerebral artery: Secondary | ICD-10-CM | POA: Diagnosis not present

## 2019-09-27 DIAGNOSIS — I69351 Hemiplegia and hemiparesis following cerebral infarction affecting right dominant side: Secondary | ICD-10-CM | POA: Diagnosis not present

## 2019-09-27 DIAGNOSIS — E782 Mixed hyperlipidemia: Secondary | ICD-10-CM | POA: Diagnosis not present

## 2019-09-27 DIAGNOSIS — I10 Essential (primary) hypertension: Secondary | ICD-10-CM | POA: Diagnosis not present

## 2019-09-27 DIAGNOSIS — E1165 Type 2 diabetes mellitus with hyperglycemia: Secondary | ICD-10-CM | POA: Diagnosis not present

## 2019-10-13 DIAGNOSIS — I69351 Hemiplegia and hemiparesis following cerebral infarction affecting right dominant side: Secondary | ICD-10-CM | POA: Diagnosis not present

## 2019-10-13 DIAGNOSIS — E1159 Type 2 diabetes mellitus with other circulatory complications: Secondary | ICD-10-CM | POA: Diagnosis not present

## 2019-10-13 DIAGNOSIS — Z0000 Encounter for general adult medical examination without abnormal findings: Secondary | ICD-10-CM | POA: Diagnosis not present

## 2019-10-13 DIAGNOSIS — E1165 Type 2 diabetes mellitus with hyperglycemia: Secondary | ICD-10-CM | POA: Diagnosis not present

## 2019-10-13 DIAGNOSIS — I63412 Cerebral infarction due to embolism of left middle cerebral artery: Secondary | ICD-10-CM | POA: Diagnosis not present

## 2019-10-23 DIAGNOSIS — B351 Tinea unguium: Secondary | ICD-10-CM | POA: Diagnosis not present

## 2019-10-23 DIAGNOSIS — L84 Corns and callosities: Secondary | ICD-10-CM | POA: Diagnosis not present

## 2019-10-23 DIAGNOSIS — E1159 Type 2 diabetes mellitus with other circulatory complications: Secondary | ICD-10-CM | POA: Diagnosis not present

## 2019-11-28 DIAGNOSIS — H401131 Primary open-angle glaucoma, bilateral, mild stage: Secondary | ICD-10-CM | POA: Diagnosis not present

## 2019-12-25 DIAGNOSIS — E1165 Type 2 diabetes mellitus with hyperglycemia: Secondary | ICD-10-CM | POA: Diagnosis not present

## 2020-01-01 DIAGNOSIS — E782 Mixed hyperlipidemia: Secondary | ICD-10-CM | POA: Diagnosis not present

## 2020-01-01 DIAGNOSIS — I10 Essential (primary) hypertension: Secondary | ICD-10-CM | POA: Diagnosis not present

## 2020-01-01 DIAGNOSIS — I69351 Hemiplegia and hemiparesis following cerebral infarction affecting right dominant side: Secondary | ICD-10-CM | POA: Diagnosis not present

## 2020-01-01 DIAGNOSIS — E1165 Type 2 diabetes mellitus with hyperglycemia: Secondary | ICD-10-CM | POA: Diagnosis not present

## 2020-01-29 DIAGNOSIS — L84 Corns and callosities: Secondary | ICD-10-CM | POA: Diagnosis not present

## 2020-01-29 DIAGNOSIS — B351 Tinea unguium: Secondary | ICD-10-CM | POA: Diagnosis not present

## 2020-01-29 DIAGNOSIS — E1159 Type 2 diabetes mellitus with other circulatory complications: Secondary | ICD-10-CM | POA: Diagnosis not present

## 2020-03-26 DIAGNOSIS — I129 Hypertensive chronic kidney disease with stage 1 through stage 4 chronic kidney disease, or unspecified chronic kidney disease: Secondary | ICD-10-CM | POA: Diagnosis not present

## 2020-03-26 DIAGNOSIS — E782 Mixed hyperlipidemia: Secondary | ICD-10-CM | POA: Diagnosis not present

## 2020-03-26 DIAGNOSIS — Z0000 Encounter for general adult medical examination without abnormal findings: Secondary | ICD-10-CM | POA: Diagnosis not present

## 2020-03-26 DIAGNOSIS — N182 Chronic kidney disease, stage 2 (mild): Secondary | ICD-10-CM | POA: Diagnosis not present

## 2020-03-26 DIAGNOSIS — E1165 Type 2 diabetes mellitus with hyperglycemia: Secondary | ICD-10-CM | POA: Diagnosis not present

## 2020-04-01 DIAGNOSIS — L84 Corns and callosities: Secondary | ICD-10-CM | POA: Diagnosis not present

## 2020-04-01 DIAGNOSIS — E1159 Type 2 diabetes mellitus with other circulatory complications: Secondary | ICD-10-CM | POA: Diagnosis not present

## 2020-04-01 DIAGNOSIS — B351 Tinea unguium: Secondary | ICD-10-CM | POA: Diagnosis not present

## 2020-04-02 DIAGNOSIS — Z23 Encounter for immunization: Secondary | ICD-10-CM | POA: Diagnosis not present

## 2020-04-02 DIAGNOSIS — E782 Mixed hyperlipidemia: Secondary | ICD-10-CM | POA: Diagnosis not present

## 2020-04-02 DIAGNOSIS — E1165 Type 2 diabetes mellitus with hyperglycemia: Secondary | ICD-10-CM | POA: Diagnosis not present

## 2020-04-02 DIAGNOSIS — I10 Essential (primary) hypertension: Secondary | ICD-10-CM | POA: Diagnosis not present

## 2020-04-02 DIAGNOSIS — I69351 Hemiplegia and hemiparesis following cerebral infarction affecting right dominant side: Secondary | ICD-10-CM | POA: Diagnosis not present

## 2020-04-18 DIAGNOSIS — I129 Hypertensive chronic kidney disease with stage 1 through stage 4 chronic kidney disease, or unspecified chronic kidney disease: Secondary | ICD-10-CM | POA: Diagnosis not present

## 2020-04-18 DIAGNOSIS — E1165 Type 2 diabetes mellitus with hyperglycemia: Secondary | ICD-10-CM | POA: Diagnosis not present

## 2020-04-18 DIAGNOSIS — E1169 Type 2 diabetes mellitus with other specified complication: Secondary | ICD-10-CM | POA: Diagnosis not present

## 2020-04-18 DIAGNOSIS — E782 Mixed hyperlipidemia: Secondary | ICD-10-CM | POA: Diagnosis not present

## 2020-04-18 DIAGNOSIS — E1159 Type 2 diabetes mellitus with other circulatory complications: Secondary | ICD-10-CM | POA: Diagnosis not present

## 2020-06-03 DIAGNOSIS — B351 Tinea unguium: Secondary | ICD-10-CM | POA: Diagnosis not present

## 2020-06-03 DIAGNOSIS — E1159 Type 2 diabetes mellitus with other circulatory complications: Secondary | ICD-10-CM | POA: Diagnosis not present

## 2020-06-03 DIAGNOSIS — L84 Corns and callosities: Secondary | ICD-10-CM | POA: Diagnosis not present

## 2020-06-04 DIAGNOSIS — H2513 Age-related nuclear cataract, bilateral: Secondary | ICD-10-CM | POA: Diagnosis not present

## 2020-06-04 DIAGNOSIS — E119 Type 2 diabetes mellitus without complications: Secondary | ICD-10-CM | POA: Diagnosis not present

## 2020-06-04 DIAGNOSIS — H401131 Primary open-angle glaucoma, bilateral, mild stage: Secondary | ICD-10-CM | POA: Diagnosis not present

## 2020-06-04 DIAGNOSIS — H25013 Cortical age-related cataract, bilateral: Secondary | ICD-10-CM | POA: Diagnosis not present

## 2020-08-05 DIAGNOSIS — B351 Tinea unguium: Secondary | ICD-10-CM | POA: Diagnosis not present

## 2020-08-05 DIAGNOSIS — L84 Corns and callosities: Secondary | ICD-10-CM | POA: Diagnosis not present

## 2020-08-05 DIAGNOSIS — E1159 Type 2 diabetes mellitus with other circulatory complications: Secondary | ICD-10-CM | POA: Diagnosis not present

## 2020-08-06 DIAGNOSIS — E119 Type 2 diabetes mellitus without complications: Secondary | ICD-10-CM | POA: Diagnosis not present

## 2020-08-06 DIAGNOSIS — H5203 Hypermetropia, bilateral: Secondary | ICD-10-CM | POA: Diagnosis not present

## 2020-08-06 DIAGNOSIS — H2513 Age-related nuclear cataract, bilateral: Secondary | ICD-10-CM | POA: Diagnosis not present

## 2020-08-06 DIAGNOSIS — H52223 Regular astigmatism, bilateral: Secondary | ICD-10-CM | POA: Diagnosis not present

## 2020-08-06 DIAGNOSIS — H524 Presbyopia: Secondary | ICD-10-CM | POA: Diagnosis not present

## 2020-08-06 DIAGNOSIS — H401134 Primary open-angle glaucoma, bilateral, indeterminate stage: Secondary | ICD-10-CM | POA: Diagnosis not present

## 2020-08-06 DIAGNOSIS — H25013 Cortical age-related cataract, bilateral: Secondary | ICD-10-CM | POA: Diagnosis not present

## 2020-08-07 DIAGNOSIS — D485 Neoplasm of uncertain behavior of skin: Secondary | ICD-10-CM | POA: Diagnosis not present

## 2020-09-23 DIAGNOSIS — E1159 Type 2 diabetes mellitus with other circulatory complications: Secondary | ICD-10-CM | POA: Diagnosis not present

## 2020-09-23 DIAGNOSIS — E1169 Type 2 diabetes mellitus with other specified complication: Secondary | ICD-10-CM | POA: Diagnosis not present

## 2020-09-23 DIAGNOSIS — I129 Hypertensive chronic kidney disease with stage 1 through stage 4 chronic kidney disease, or unspecified chronic kidney disease: Secondary | ICD-10-CM | POA: Diagnosis not present

## 2020-09-23 DIAGNOSIS — E1165 Type 2 diabetes mellitus with hyperglycemia: Secondary | ICD-10-CM | POA: Diagnosis not present

## 2020-09-23 DIAGNOSIS — E782 Mixed hyperlipidemia: Secondary | ICD-10-CM | POA: Diagnosis not present

## 2020-09-23 DIAGNOSIS — N182 Chronic kidney disease, stage 2 (mild): Secondary | ICD-10-CM | POA: Diagnosis not present

## 2020-09-30 DIAGNOSIS — E1165 Type 2 diabetes mellitus with hyperglycemia: Secondary | ICD-10-CM | POA: Diagnosis not present

## 2020-09-30 DIAGNOSIS — I10 Essential (primary) hypertension: Secondary | ICD-10-CM | POA: Diagnosis not present

## 2020-09-30 DIAGNOSIS — E782 Mixed hyperlipidemia: Secondary | ICD-10-CM | POA: Diagnosis not present

## 2020-09-30 DIAGNOSIS — Z8673 Personal history of transient ischemic attack (TIA), and cerebral infarction without residual deficits: Secondary | ICD-10-CM | POA: Diagnosis not present

## 2020-10-03 DIAGNOSIS — N182 Chronic kidney disease, stage 2 (mild): Secondary | ICD-10-CM | POA: Diagnosis not present

## 2020-10-03 DIAGNOSIS — E1122 Type 2 diabetes mellitus with diabetic chronic kidney disease: Secondary | ICD-10-CM | POA: Diagnosis not present

## 2020-10-03 DIAGNOSIS — I129 Hypertensive chronic kidney disease with stage 1 through stage 4 chronic kidney disease, or unspecified chronic kidney disease: Secondary | ICD-10-CM | POA: Diagnosis not present

## 2020-10-03 DIAGNOSIS — E782 Mixed hyperlipidemia: Secondary | ICD-10-CM | POA: Diagnosis not present

## 2020-10-07 DIAGNOSIS — L84 Corns and callosities: Secondary | ICD-10-CM | POA: Diagnosis not present

## 2020-10-07 DIAGNOSIS — B351 Tinea unguium: Secondary | ICD-10-CM | POA: Diagnosis not present

## 2020-10-07 DIAGNOSIS — E1159 Type 2 diabetes mellitus with other circulatory complications: Secondary | ICD-10-CM | POA: Diagnosis not present

## 2020-10-14 DIAGNOSIS — E1165 Type 2 diabetes mellitus with hyperglycemia: Secondary | ICD-10-CM | POA: Diagnosis not present

## 2020-10-14 DIAGNOSIS — E782 Mixed hyperlipidemia: Secondary | ICD-10-CM | POA: Diagnosis not present

## 2020-10-21 DIAGNOSIS — Z0000 Encounter for general adult medical examination without abnormal findings: Secondary | ICD-10-CM | POA: Diagnosis not present

## 2020-10-21 DIAGNOSIS — I129 Hypertensive chronic kidney disease with stage 1 through stage 4 chronic kidney disease, or unspecified chronic kidney disease: Secondary | ICD-10-CM | POA: Diagnosis not present

## 2020-10-21 DIAGNOSIS — I69351 Hemiplegia and hemiparesis following cerebral infarction affecting right dominant side: Secondary | ICD-10-CM | POA: Diagnosis not present

## 2020-10-21 DIAGNOSIS — N1831 Chronic kidney disease, stage 3a: Secondary | ICD-10-CM | POA: Diagnosis not present

## 2020-10-21 DIAGNOSIS — E782 Mixed hyperlipidemia: Secondary | ICD-10-CM | POA: Diagnosis not present

## 2020-10-21 DIAGNOSIS — E1165 Type 2 diabetes mellitus with hyperglycemia: Secondary | ICD-10-CM | POA: Diagnosis not present

## 2020-10-21 DIAGNOSIS — M792 Neuralgia and neuritis, unspecified: Secondary | ICD-10-CM | POA: Diagnosis not present

## 2020-11-26 DIAGNOSIS — L720 Epidermal cyst: Secondary | ICD-10-CM | POA: Diagnosis not present

## 2020-12-03 DIAGNOSIS — I129 Hypertensive chronic kidney disease with stage 1 through stage 4 chronic kidney disease, or unspecified chronic kidney disease: Secondary | ICD-10-CM | POA: Diagnosis not present

## 2020-12-03 DIAGNOSIS — E1122 Type 2 diabetes mellitus with diabetic chronic kidney disease: Secondary | ICD-10-CM | POA: Diagnosis not present

## 2020-12-03 DIAGNOSIS — H401131 Primary open-angle glaucoma, bilateral, mild stage: Secondary | ICD-10-CM | POA: Diagnosis not present

## 2020-12-03 DIAGNOSIS — N182 Chronic kidney disease, stage 2 (mild): Secondary | ICD-10-CM | POA: Diagnosis not present

## 2020-12-03 DIAGNOSIS — E782 Mixed hyperlipidemia: Secondary | ICD-10-CM | POA: Diagnosis not present

## 2020-12-09 DIAGNOSIS — E1159 Type 2 diabetes mellitus with other circulatory complications: Secondary | ICD-10-CM | POA: Diagnosis not present

## 2020-12-09 DIAGNOSIS — B351 Tinea unguium: Secondary | ICD-10-CM | POA: Diagnosis not present

## 2020-12-09 DIAGNOSIS — L84 Corns and callosities: Secondary | ICD-10-CM | POA: Diagnosis not present

## 2021-01-02 DIAGNOSIS — E782 Mixed hyperlipidemia: Secondary | ICD-10-CM | POA: Diagnosis not present

## 2021-01-02 DIAGNOSIS — E1122 Type 2 diabetes mellitus with diabetic chronic kidney disease: Secondary | ICD-10-CM | POA: Diagnosis not present

## 2021-01-02 DIAGNOSIS — N182 Chronic kidney disease, stage 2 (mild): Secondary | ICD-10-CM | POA: Diagnosis not present

## 2021-01-02 DIAGNOSIS — I129 Hypertensive chronic kidney disease with stage 1 through stage 4 chronic kidney disease, or unspecified chronic kidney disease: Secondary | ICD-10-CM | POA: Diagnosis not present

## 2021-01-08 DIAGNOSIS — R109 Unspecified abdominal pain: Secondary | ICD-10-CM | POA: Diagnosis not present

## 2021-01-09 DIAGNOSIS — R109 Unspecified abdominal pain: Secondary | ICD-10-CM | POA: Diagnosis not present

## 2021-01-09 DIAGNOSIS — R1013 Epigastric pain: Secondary | ICD-10-CM | POA: Diagnosis not present

## 2021-01-09 DIAGNOSIS — R140 Abdominal distension (gaseous): Secondary | ICD-10-CM | POA: Diagnosis not present

## 2021-01-09 DIAGNOSIS — K439 Ventral hernia without obstruction or gangrene: Secondary | ICD-10-CM | POA: Diagnosis not present

## 2021-01-13 ENCOUNTER — Other Ambulatory Visit: Payer: Self-pay

## 2021-01-13 ENCOUNTER — Other Ambulatory Visit: Payer: Self-pay | Admitting: Internal Medicine

## 2021-01-13 DIAGNOSIS — R140 Abdominal distension (gaseous): Secondary | ICD-10-CM

## 2021-01-13 DIAGNOSIS — R1013 Epigastric pain: Secondary | ICD-10-CM

## 2021-01-14 ENCOUNTER — Other Ambulatory Visit: Payer: Self-pay

## 2021-01-14 ENCOUNTER — Ambulatory Visit
Admission: RE | Admit: 2021-01-14 | Discharge: 2021-01-14 | Disposition: A | Discharge status: Home / Self Care | Payer: Self-pay | Source: Ambulatory Visit | Attending: Internal Medicine | Admitting: Internal Medicine

## 2021-01-14 DIAGNOSIS — R1013 Epigastric pain: Secondary | ICD-10-CM

## 2021-01-14 DIAGNOSIS — R140 Abdominal distension (gaseous): Secondary | ICD-10-CM

## 2021-01-20 DIAGNOSIS — R1013 Epigastric pain: Secondary | ICD-10-CM | POA: Diagnosis not present

## 2021-01-20 DIAGNOSIS — R748 Abnormal levels of other serum enzymes: Secondary | ICD-10-CM | POA: Diagnosis not present

## 2021-01-20 DIAGNOSIS — E1165 Type 2 diabetes mellitus with hyperglycemia: Secondary | ICD-10-CM | POA: Diagnosis not present

## 2021-01-20 DIAGNOSIS — R140 Abdominal distension (gaseous): Secondary | ICD-10-CM | POA: Diagnosis not present

## 2021-02-02 DIAGNOSIS — E1122 Type 2 diabetes mellitus with diabetic chronic kidney disease: Secondary | ICD-10-CM | POA: Diagnosis not present

## 2021-02-02 DIAGNOSIS — E782 Mixed hyperlipidemia: Secondary | ICD-10-CM | POA: Diagnosis not present

## 2021-02-02 DIAGNOSIS — N182 Chronic kidney disease, stage 2 (mild): Secondary | ICD-10-CM | POA: Diagnosis not present

## 2021-02-02 DIAGNOSIS — I129 Hypertensive chronic kidney disease with stage 1 through stage 4 chronic kidney disease, or unspecified chronic kidney disease: Secondary | ICD-10-CM | POA: Diagnosis not present

## 2021-02-17 DIAGNOSIS — B351 Tinea unguium: Secondary | ICD-10-CM | POA: Diagnosis not present

## 2021-02-17 DIAGNOSIS — L84 Corns and callosities: Secondary | ICD-10-CM | POA: Diagnosis not present

## 2021-02-17 DIAGNOSIS — E1159 Type 2 diabetes mellitus with other circulatory complications: Secondary | ICD-10-CM | POA: Diagnosis not present

## 2021-03-03 DIAGNOSIS — Z86010 Personal history of colonic polyps: Secondary | ICD-10-CM | POA: Diagnosis not present

## 2021-03-03 DIAGNOSIS — K5900 Constipation, unspecified: Secondary | ICD-10-CM | POA: Diagnosis not present

## 2021-03-03 DIAGNOSIS — I6789 Other cerebrovascular disease: Secondary | ICD-10-CM | POA: Diagnosis not present

## 2021-03-05 DIAGNOSIS — N182 Chronic kidney disease, stage 2 (mild): Secondary | ICD-10-CM | POA: Diagnosis not present

## 2021-03-05 DIAGNOSIS — I129 Hypertensive chronic kidney disease with stage 1 through stage 4 chronic kidney disease, or unspecified chronic kidney disease: Secondary | ICD-10-CM | POA: Diagnosis not present

## 2021-03-05 DIAGNOSIS — E782 Mixed hyperlipidemia: Secondary | ICD-10-CM | POA: Diagnosis not present

## 2021-03-05 DIAGNOSIS — E1122 Type 2 diabetes mellitus with diabetic chronic kidney disease: Secondary | ICD-10-CM | POA: Diagnosis not present

## 2021-03-25 DIAGNOSIS — D125 Benign neoplasm of sigmoid colon: Secondary | ICD-10-CM | POA: Diagnosis not present

## 2021-03-25 DIAGNOSIS — D123 Benign neoplasm of transverse colon: Secondary | ICD-10-CM | POA: Diagnosis not present

## 2021-03-25 DIAGNOSIS — Z86010 Personal history of colonic polyps: Secondary | ICD-10-CM | POA: Diagnosis not present

## 2021-03-25 DIAGNOSIS — K635 Polyp of colon: Secondary | ICD-10-CM | POA: Diagnosis not present

## 2021-03-25 DIAGNOSIS — K621 Rectal polyp: Secondary | ICD-10-CM | POA: Diagnosis not present

## 2021-03-31 DIAGNOSIS — E1165 Type 2 diabetes mellitus with hyperglycemia: Secondary | ICD-10-CM | POA: Diagnosis not present

## 2021-03-31 DIAGNOSIS — E782 Mixed hyperlipidemia: Secondary | ICD-10-CM | POA: Diagnosis not present

## 2021-04-04 DIAGNOSIS — E1122 Type 2 diabetes mellitus with diabetic chronic kidney disease: Secondary | ICD-10-CM | POA: Diagnosis not present

## 2021-04-04 DIAGNOSIS — I129 Hypertensive chronic kidney disease with stage 1 through stage 4 chronic kidney disease, or unspecified chronic kidney disease: Secondary | ICD-10-CM | POA: Diagnosis not present

## 2021-04-04 DIAGNOSIS — N182 Chronic kidney disease, stage 2 (mild): Secondary | ICD-10-CM | POA: Diagnosis not present

## 2021-04-04 DIAGNOSIS — E782 Mixed hyperlipidemia: Secondary | ICD-10-CM | POA: Diagnosis not present

## 2021-04-07 DIAGNOSIS — I10 Essential (primary) hypertension: Secondary | ICD-10-CM | POA: Diagnosis not present

## 2021-04-07 DIAGNOSIS — Z8673 Personal history of transient ischemic attack (TIA), and cerebral infarction without residual deficits: Secondary | ICD-10-CM | POA: Diagnosis not present

## 2021-04-07 DIAGNOSIS — E1169 Type 2 diabetes mellitus with other specified complication: Secondary | ICD-10-CM | POA: Diagnosis not present

## 2021-04-07 DIAGNOSIS — E782 Mixed hyperlipidemia: Secondary | ICD-10-CM | POA: Diagnosis not present

## 2021-04-07 DIAGNOSIS — E1165 Type 2 diabetes mellitus with hyperglycemia: Secondary | ICD-10-CM | POA: Diagnosis not present

## 2021-04-14 DIAGNOSIS — H6121 Impacted cerumen, right ear: Secondary | ICD-10-CM | POA: Diagnosis not present

## 2021-04-14 DIAGNOSIS — I69351 Hemiplegia and hemiparesis following cerebral infarction affecting right dominant side: Secondary | ICD-10-CM | POA: Diagnosis not present

## 2021-04-14 DIAGNOSIS — E782 Mixed hyperlipidemia: Secondary | ICD-10-CM | POA: Diagnosis not present

## 2021-04-14 DIAGNOSIS — M792 Neuralgia and neuritis, unspecified: Secondary | ICD-10-CM | POA: Diagnosis not present

## 2021-04-14 DIAGNOSIS — Z23 Encounter for immunization: Secondary | ICD-10-CM | POA: Diagnosis not present

## 2021-04-14 DIAGNOSIS — E1165 Type 2 diabetes mellitus with hyperglycemia: Secondary | ICD-10-CM | POA: Diagnosis not present

## 2021-04-14 DIAGNOSIS — N1831 Chronic kidney disease, stage 3a: Secondary | ICD-10-CM | POA: Diagnosis not present

## 2021-04-14 DIAGNOSIS — I129 Hypertensive chronic kidney disease with stage 1 through stage 4 chronic kidney disease, or unspecified chronic kidney disease: Secondary | ICD-10-CM | POA: Diagnosis not present

## 2021-04-21 DIAGNOSIS — E1159 Type 2 diabetes mellitus with other circulatory complications: Secondary | ICD-10-CM | POA: Diagnosis not present

## 2021-04-21 DIAGNOSIS — B351 Tinea unguium: Secondary | ICD-10-CM | POA: Diagnosis not present

## 2021-04-21 DIAGNOSIS — L84 Corns and callosities: Secondary | ICD-10-CM | POA: Diagnosis not present

## 2021-05-05 DIAGNOSIS — I129 Hypertensive chronic kidney disease with stage 1 through stage 4 chronic kidney disease, or unspecified chronic kidney disease: Secondary | ICD-10-CM | POA: Diagnosis not present

## 2021-05-05 DIAGNOSIS — E1122 Type 2 diabetes mellitus with diabetic chronic kidney disease: Secondary | ICD-10-CM | POA: Diagnosis not present

## 2021-05-05 DIAGNOSIS — N182 Chronic kidney disease, stage 2 (mild): Secondary | ICD-10-CM | POA: Diagnosis not present

## 2021-05-05 DIAGNOSIS — E782 Mixed hyperlipidemia: Secondary | ICD-10-CM | POA: Diagnosis not present

## 2021-05-19 DIAGNOSIS — Z8673 Personal history of transient ischemic attack (TIA), and cerebral infarction without residual deficits: Secondary | ICD-10-CM | POA: Diagnosis not present

## 2021-05-19 DIAGNOSIS — E782 Mixed hyperlipidemia: Secondary | ICD-10-CM | POA: Diagnosis not present

## 2021-05-19 DIAGNOSIS — I10 Essential (primary) hypertension: Secondary | ICD-10-CM | POA: Diagnosis not present

## 2021-05-19 DIAGNOSIS — E1169 Type 2 diabetes mellitus with other specified complication: Secondary | ICD-10-CM | POA: Diagnosis not present

## 2021-05-19 DIAGNOSIS — E1165 Type 2 diabetes mellitus with hyperglycemia: Secondary | ICD-10-CM | POA: Diagnosis not present

## 2021-06-23 DIAGNOSIS — B351 Tinea unguium: Secondary | ICD-10-CM | POA: Diagnosis not present

## 2021-06-23 DIAGNOSIS — L84 Corns and callosities: Secondary | ICD-10-CM | POA: Diagnosis not present

## 2021-06-23 DIAGNOSIS — E1159 Type 2 diabetes mellitus with other circulatory complications: Secondary | ICD-10-CM | POA: Diagnosis not present

## 2021-07-01 DIAGNOSIS — E1169 Type 2 diabetes mellitus with other specified complication: Secondary | ICD-10-CM | POA: Diagnosis not present

## 2021-07-01 DIAGNOSIS — E1165 Type 2 diabetes mellitus with hyperglycemia: Secondary | ICD-10-CM | POA: Diagnosis not present

## 2021-07-01 DIAGNOSIS — E782 Mixed hyperlipidemia: Secondary | ICD-10-CM | POA: Diagnosis not present

## 2021-07-01 DIAGNOSIS — Z8673 Personal history of transient ischemic attack (TIA), and cerebral infarction without residual deficits: Secondary | ICD-10-CM | POA: Diagnosis not present

## 2021-07-01 DIAGNOSIS — I10 Essential (primary) hypertension: Secondary | ICD-10-CM | POA: Diagnosis not present

## 2021-08-11 DIAGNOSIS — E119 Type 2 diabetes mellitus without complications: Secondary | ICD-10-CM | POA: Diagnosis not present

## 2021-08-11 DIAGNOSIS — H524 Presbyopia: Secondary | ICD-10-CM | POA: Diagnosis not present

## 2021-08-11 DIAGNOSIS — H5203 Hypermetropia, bilateral: Secondary | ICD-10-CM | POA: Diagnosis not present

## 2021-08-11 DIAGNOSIS — H2513 Age-related nuclear cataract, bilateral: Secondary | ICD-10-CM | POA: Diagnosis not present

## 2021-08-11 DIAGNOSIS — H25013 Cortical age-related cataract, bilateral: Secondary | ICD-10-CM | POA: Diagnosis not present

## 2021-08-11 DIAGNOSIS — H52223 Regular astigmatism, bilateral: Secondary | ICD-10-CM | POA: Diagnosis not present

## 2021-08-11 DIAGNOSIS — H401132 Primary open-angle glaucoma, bilateral, moderate stage: Secondary | ICD-10-CM | POA: Diagnosis not present

## 2021-08-25 DIAGNOSIS — L84 Corns and callosities: Secondary | ICD-10-CM | POA: Diagnosis not present

## 2021-08-25 DIAGNOSIS — B351 Tinea unguium: Secondary | ICD-10-CM | POA: Diagnosis not present

## 2021-08-25 DIAGNOSIS — E1159 Type 2 diabetes mellitus with other circulatory complications: Secondary | ICD-10-CM | POA: Diagnosis not present

## 2021-10-27 DIAGNOSIS — E1165 Type 2 diabetes mellitus with hyperglycemia: Secondary | ICD-10-CM | POA: Diagnosis not present

## 2021-10-27 DIAGNOSIS — R5383 Other fatigue: Secondary | ICD-10-CM | POA: Diagnosis not present

## 2021-10-27 DIAGNOSIS — M792 Neuralgia and neuritis, unspecified: Secondary | ICD-10-CM | POA: Diagnosis not present

## 2021-10-27 DIAGNOSIS — Z0000 Encounter for general adult medical examination without abnormal findings: Secondary | ICD-10-CM | POA: Diagnosis not present

## 2021-10-27 DIAGNOSIS — Z23 Encounter for immunization: Secondary | ICD-10-CM | POA: Diagnosis not present

## 2021-10-27 DIAGNOSIS — N1831 Chronic kidney disease, stage 3a: Secondary | ICD-10-CM | POA: Diagnosis not present

## 2021-10-27 DIAGNOSIS — E1159 Type 2 diabetes mellitus with other circulatory complications: Secondary | ICD-10-CM | POA: Diagnosis not present

## 2021-10-27 DIAGNOSIS — L84 Corns and callosities: Secondary | ICD-10-CM | POA: Diagnosis not present

## 2021-10-27 DIAGNOSIS — E782 Mixed hyperlipidemia: Secondary | ICD-10-CM | POA: Diagnosis not present

## 2021-10-27 DIAGNOSIS — B351 Tinea unguium: Secondary | ICD-10-CM | POA: Diagnosis not present

## 2021-10-27 DIAGNOSIS — I69351 Hemiplegia and hemiparesis following cerebral infarction affecting right dominant side: Secondary | ICD-10-CM | POA: Diagnosis not present

## 2021-10-27 DIAGNOSIS — I129 Hypertensive chronic kidney disease with stage 1 through stage 4 chronic kidney disease, or unspecified chronic kidney disease: Secondary | ICD-10-CM | POA: Diagnosis not present

## 2021-11-05 DIAGNOSIS — Z8673 Personal history of transient ischemic attack (TIA), and cerebral infarction without residual deficits: Secondary | ICD-10-CM | POA: Diagnosis not present

## 2021-11-05 DIAGNOSIS — E1165 Type 2 diabetes mellitus with hyperglycemia: Secondary | ICD-10-CM | POA: Diagnosis not present

## 2021-11-05 DIAGNOSIS — E1169 Type 2 diabetes mellitus with other specified complication: Secondary | ICD-10-CM | POA: Diagnosis not present

## 2021-11-05 DIAGNOSIS — I10 Essential (primary) hypertension: Secondary | ICD-10-CM | POA: Diagnosis not present

## 2021-11-05 DIAGNOSIS — E782 Mixed hyperlipidemia: Secondary | ICD-10-CM | POA: Diagnosis not present

## 2021-11-11 DIAGNOSIS — I2510 Atherosclerotic heart disease of native coronary artery without angina pectoris: Secondary | ICD-10-CM | POA: Diagnosis not present

## 2021-11-11 DIAGNOSIS — Z880 Allergy status to penicillin: Secondary | ICD-10-CM | POA: Diagnosis not present

## 2021-11-11 DIAGNOSIS — Z87891 Personal history of nicotine dependence: Secondary | ICD-10-CM | POA: Diagnosis not present

## 2021-11-11 DIAGNOSIS — Z888 Allergy status to other drugs, medicaments and biological substances status: Secondary | ICD-10-CM | POA: Diagnosis not present

## 2021-11-11 DIAGNOSIS — I3139 Other pericardial effusion (noninflammatory): Secondary | ICD-10-CM | POA: Diagnosis not present

## 2021-11-11 DIAGNOSIS — R0789 Other chest pain: Secondary | ICD-10-CM | POA: Diagnosis not present

## 2021-11-11 DIAGNOSIS — R911 Solitary pulmonary nodule: Secondary | ICD-10-CM | POA: Diagnosis not present

## 2021-11-11 DIAGNOSIS — R079 Chest pain, unspecified: Secondary | ICD-10-CM | POA: Diagnosis not present

## 2021-11-12 DIAGNOSIS — R079 Chest pain, unspecified: Secondary | ICD-10-CM | POA: Diagnosis not present

## 2021-11-12 DIAGNOSIS — I10 Essential (primary) hypertension: Secondary | ICD-10-CM | POA: Diagnosis not present

## 2021-11-12 DIAGNOSIS — I2510 Atherosclerotic heart disease of native coronary artery without angina pectoris: Secondary | ICD-10-CM | POA: Diagnosis not present

## 2021-11-12 DIAGNOSIS — I4510 Unspecified right bundle-branch block: Secondary | ICD-10-CM | POA: Diagnosis not present

## 2021-11-12 DIAGNOSIS — R911 Solitary pulmonary nodule: Secondary | ICD-10-CM | POA: Diagnosis not present

## 2021-11-12 DIAGNOSIS — E785 Hyperlipidemia, unspecified: Secondary | ICD-10-CM | POA: Diagnosis not present

## 2021-11-12 DIAGNOSIS — E1169 Type 2 diabetes mellitus with other specified complication: Secondary | ICD-10-CM | POA: Diagnosis not present

## 2021-11-25 DIAGNOSIS — R911 Solitary pulmonary nodule: Secondary | ICD-10-CM | POA: Diagnosis not present

## 2021-11-25 DIAGNOSIS — K5900 Constipation, unspecified: Secondary | ICD-10-CM | POA: Diagnosis not present

## 2021-12-02 DIAGNOSIS — I10 Essential (primary) hypertension: Secondary | ICD-10-CM | POA: Diagnosis not present

## 2021-12-02 DIAGNOSIS — I2510 Atherosclerotic heart disease of native coronary artery without angina pectoris: Secondary | ICD-10-CM | POA: Diagnosis not present

## 2021-12-15 DIAGNOSIS — I2510 Atherosclerotic heart disease of native coronary artery without angina pectoris: Secondary | ICD-10-CM | POA: Diagnosis not present

## 2021-12-15 DIAGNOSIS — R079 Chest pain, unspecified: Secondary | ICD-10-CM | POA: Diagnosis not present

## 2021-12-18 DIAGNOSIS — I2510 Atherosclerotic heart disease of native coronary artery without angina pectoris: Secondary | ICD-10-CM | POA: Diagnosis not present

## 2021-12-18 DIAGNOSIS — I4510 Unspecified right bundle-branch block: Secondary | ICD-10-CM | POA: Diagnosis not present

## 2021-12-18 DIAGNOSIS — I350 Nonrheumatic aortic (valve) stenosis: Secondary | ICD-10-CM | POA: Diagnosis not present

## 2021-12-18 DIAGNOSIS — I10 Essential (primary) hypertension: Secondary | ICD-10-CM | POA: Diagnosis not present

## 2021-12-18 DIAGNOSIS — E785 Hyperlipidemia, unspecified: Secondary | ICD-10-CM | POA: Diagnosis not present

## 2021-12-18 DIAGNOSIS — R071 Chest pain on breathing: Secondary | ICD-10-CM | POA: Diagnosis not present

## 2021-12-29 DIAGNOSIS — L84 Corns and callosities: Secondary | ICD-10-CM | POA: Diagnosis not present

## 2021-12-29 DIAGNOSIS — E1159 Type 2 diabetes mellitus with other circulatory complications: Secondary | ICD-10-CM | POA: Diagnosis not present

## 2021-12-29 DIAGNOSIS — B351 Tinea unguium: Secondary | ICD-10-CM | POA: Diagnosis not present

## 2021-12-30 DIAGNOSIS — R202 Paresthesia of skin: Secondary | ICD-10-CM | POA: Diagnosis not present

## 2021-12-30 DIAGNOSIS — M792 Neuralgia and neuritis, unspecified: Secondary | ICD-10-CM | POA: Diagnosis not present

## 2021-12-30 DIAGNOSIS — E1159 Type 2 diabetes mellitus with other circulatory complications: Secondary | ICD-10-CM | POA: Diagnosis not present

## 2021-12-30 DIAGNOSIS — K6289 Other specified diseases of anus and rectum: Secondary | ICD-10-CM | POA: Diagnosis not present

## 2021-12-30 DIAGNOSIS — R911 Solitary pulmonary nodule: Secondary | ICD-10-CM | POA: Diagnosis not present

## 2021-12-30 DIAGNOSIS — Z0000 Encounter for general adult medical examination without abnormal findings: Secondary | ICD-10-CM | POA: Diagnosis not present

## 2021-12-30 DIAGNOSIS — N1831 Chronic kidney disease, stage 3a: Secondary | ICD-10-CM | POA: Diagnosis not present

## 2022-01-01 DIAGNOSIS — I10 Essential (primary) hypertension: Secondary | ICD-10-CM | POA: Diagnosis not present

## 2022-01-01 DIAGNOSIS — Z77090 Contact with and (suspected) exposure to asbestos: Secondary | ICD-10-CM | POA: Diagnosis not present

## 2022-01-01 DIAGNOSIS — R911 Solitary pulmonary nodule: Secondary | ICD-10-CM | POA: Diagnosis not present

## 2022-01-01 DIAGNOSIS — F17211 Nicotine dependence, cigarettes, in remission: Secondary | ICD-10-CM | POA: Diagnosis not present

## 2022-01-02 DIAGNOSIS — N1831 Chronic kidney disease, stage 3a: Secondary | ICD-10-CM | POA: Diagnosis not present

## 2022-01-02 DIAGNOSIS — E1165 Type 2 diabetes mellitus with hyperglycemia: Secondary | ICD-10-CM | POA: Diagnosis not present

## 2022-01-02 DIAGNOSIS — E1169 Type 2 diabetes mellitus with other specified complication: Secondary | ICD-10-CM | POA: Diagnosis not present

## 2022-01-02 DIAGNOSIS — E1159 Type 2 diabetes mellitus with other circulatory complications: Secondary | ICD-10-CM | POA: Diagnosis not present

## 2022-01-19 DIAGNOSIS — K5900 Constipation, unspecified: Secondary | ICD-10-CM | POA: Diagnosis not present

## 2022-01-19 DIAGNOSIS — Z86010 Personal history of colonic polyps: Secondary | ICD-10-CM | POA: Diagnosis not present

## 2022-01-19 DIAGNOSIS — R933 Abnormal findings on diagnostic imaging of other parts of digestive tract: Secondary | ICD-10-CM | POA: Diagnosis not present

## 2022-02-05 DIAGNOSIS — Z86010 Personal history of colonic polyps: Secondary | ICD-10-CM | POA: Diagnosis not present

## 2022-02-05 DIAGNOSIS — K621 Rectal polyp: Secondary | ICD-10-CM | POA: Diagnosis not present

## 2022-02-05 DIAGNOSIS — K635 Polyp of colon: Secondary | ICD-10-CM | POA: Diagnosis not present

## 2022-02-09 DIAGNOSIS — H401132 Primary open-angle glaucoma, bilateral, moderate stage: Secondary | ICD-10-CM | POA: Diagnosis not present

## 2022-02-09 DIAGNOSIS — H25013 Cortical age-related cataract, bilateral: Secondary | ICD-10-CM | POA: Diagnosis not present

## 2022-02-09 DIAGNOSIS — H2513 Age-related nuclear cataract, bilateral: Secondary | ICD-10-CM | POA: Diagnosis not present

## 2022-02-25 DIAGNOSIS — R911 Solitary pulmonary nodule: Secondary | ICD-10-CM | POA: Diagnosis not present

## 2022-02-25 DIAGNOSIS — I2510 Atherosclerotic heart disease of native coronary artery without angina pectoris: Secondary | ICD-10-CM | POA: Diagnosis not present

## 2022-03-02 DIAGNOSIS — B351 Tinea unguium: Secondary | ICD-10-CM | POA: Diagnosis not present

## 2022-03-02 DIAGNOSIS — M79671 Pain in right foot: Secondary | ICD-10-CM | POA: Diagnosis not present

## 2022-03-02 DIAGNOSIS — L84 Corns and callosities: Secondary | ICD-10-CM | POA: Diagnosis not present

## 2022-03-02 DIAGNOSIS — M79672 Pain in left foot: Secondary | ICD-10-CM | POA: Diagnosis not present

## 2022-03-02 DIAGNOSIS — E1159 Type 2 diabetes mellitus with other circulatory complications: Secondary | ICD-10-CM | POA: Diagnosis not present

## 2022-03-16 DIAGNOSIS — F17211 Nicotine dependence, cigarettes, in remission: Secondary | ICD-10-CM | POA: Diagnosis not present

## 2022-03-16 DIAGNOSIS — R911 Solitary pulmonary nodule: Secondary | ICD-10-CM | POA: Diagnosis not present

## 2022-03-16 DIAGNOSIS — I10 Essential (primary) hypertension: Secondary | ICD-10-CM | POA: Diagnosis not present

## 2022-03-16 DIAGNOSIS — Z77090 Contact with and (suspected) exposure to asbestos: Secondary | ICD-10-CM | POA: Diagnosis not present

## 2022-03-19 DIAGNOSIS — I4510 Unspecified right bundle-branch block: Secondary | ICD-10-CM | POA: Diagnosis not present

## 2022-03-19 DIAGNOSIS — I10 Essential (primary) hypertension: Secondary | ICD-10-CM | POA: Diagnosis not present

## 2022-03-19 DIAGNOSIS — I350 Nonrheumatic aortic (valve) stenosis: Secondary | ICD-10-CM | POA: Diagnosis not present

## 2022-03-19 DIAGNOSIS — I2510 Atherosclerotic heart disease of native coronary artery without angina pectoris: Secondary | ICD-10-CM | POA: Diagnosis not present

## 2022-03-19 DIAGNOSIS — E785 Hyperlipidemia, unspecified: Secondary | ICD-10-CM | POA: Diagnosis not present

## 2022-04-01 DIAGNOSIS — I129 Hypertensive chronic kidney disease with stage 1 through stage 4 chronic kidney disease, or unspecified chronic kidney disease: Secondary | ICD-10-CM | POA: Diagnosis not present

## 2022-04-01 DIAGNOSIS — N1831 Chronic kidney disease, stage 3a: Secondary | ICD-10-CM | POA: Diagnosis not present

## 2022-04-01 DIAGNOSIS — I10 Essential (primary) hypertension: Secondary | ICD-10-CM | POA: Diagnosis not present

## 2022-04-01 DIAGNOSIS — E782 Mixed hyperlipidemia: Secondary | ICD-10-CM | POA: Diagnosis not present

## 2022-04-01 DIAGNOSIS — E1165 Type 2 diabetes mellitus with hyperglycemia: Secondary | ICD-10-CM | POA: Diagnosis not present

## 2022-05-04 DIAGNOSIS — M79672 Pain in left foot: Secondary | ICD-10-CM | POA: Diagnosis not present

## 2022-05-04 DIAGNOSIS — M79671 Pain in right foot: Secondary | ICD-10-CM | POA: Diagnosis not present

## 2022-05-04 DIAGNOSIS — L84 Corns and callosities: Secondary | ICD-10-CM | POA: Diagnosis not present

## 2022-05-04 DIAGNOSIS — E1159 Type 2 diabetes mellitus with other circulatory complications: Secondary | ICD-10-CM | POA: Diagnosis not present

## 2022-05-04 DIAGNOSIS — B351 Tinea unguium: Secondary | ICD-10-CM | POA: Diagnosis not present

## 2022-06-10 DIAGNOSIS — I2510 Atherosclerotic heart disease of native coronary artery without angina pectoris: Secondary | ICD-10-CM | POA: Diagnosis not present

## 2022-06-10 DIAGNOSIS — R7989 Other specified abnormal findings of blood chemistry: Secondary | ICD-10-CM | POA: Diagnosis not present

## 2022-06-10 DIAGNOSIS — E119 Type 2 diabetes mellitus without complications: Secondary | ICD-10-CM | POA: Diagnosis not present

## 2022-06-10 DIAGNOSIS — D649 Anemia, unspecified: Secondary | ICD-10-CM | POA: Diagnosis not present

## 2022-06-10 DIAGNOSIS — I214 Non-ST elevation (NSTEMI) myocardial infarction: Secondary | ICD-10-CM | POA: Diagnosis not present

## 2022-06-10 DIAGNOSIS — E785 Hyperlipidemia, unspecified: Secondary | ICD-10-CM | POA: Diagnosis not present

## 2022-06-10 DIAGNOSIS — R911 Solitary pulmonary nodule: Secondary | ICD-10-CM | POA: Diagnosis not present

## 2022-06-10 DIAGNOSIS — R059 Cough, unspecified: Secondary | ICD-10-CM | POA: Diagnosis not present

## 2022-06-10 DIAGNOSIS — Z8673 Personal history of transient ischemic attack (TIA), and cerebral infarction without residual deficits: Secondary | ICD-10-CM | POA: Diagnosis not present

## 2022-06-10 DIAGNOSIS — E875 Hyperkalemia: Secondary | ICD-10-CM | POA: Diagnosis not present

## 2022-06-10 DIAGNOSIS — R0602 Shortness of breath: Secondary | ICD-10-CM | POA: Diagnosis not present

## 2022-06-10 DIAGNOSIS — I10 Essential (primary) hypertension: Secondary | ICD-10-CM | POA: Diagnosis not present

## 2022-06-10 DIAGNOSIS — R9431 Abnormal electrocardiogram [ECG] [EKG]: Secondary | ICD-10-CM | POA: Diagnosis not present

## 2022-06-10 DIAGNOSIS — E089 Diabetes mellitus due to underlying condition without complications: Secondary | ICD-10-CM | POA: Diagnosis not present

## 2022-06-10 DIAGNOSIS — J22 Unspecified acute lower respiratory infection: Secondary | ICD-10-CM | POA: Diagnosis not present

## 2022-06-11 DIAGNOSIS — D649 Anemia, unspecified: Secondary | ICD-10-CM | POA: Diagnosis not present

## 2022-06-11 DIAGNOSIS — I2510 Atherosclerotic heart disease of native coronary artery without angina pectoris: Secondary | ICD-10-CM | POA: Diagnosis not present

## 2022-06-11 DIAGNOSIS — R7989 Other specified abnormal findings of blood chemistry: Secondary | ICD-10-CM | POA: Diagnosis not present

## 2022-06-11 DIAGNOSIS — I10 Essential (primary) hypertension: Secondary | ICD-10-CM | POA: Diagnosis not present

## 2022-06-11 DIAGNOSIS — R911 Solitary pulmonary nodule: Secondary | ICD-10-CM | POA: Diagnosis not present

## 2022-06-11 DIAGNOSIS — E785 Hyperlipidemia, unspecified: Secondary | ICD-10-CM | POA: Diagnosis not present

## 2022-06-11 DIAGNOSIS — Z8673 Personal history of transient ischemic attack (TIA), and cerebral infarction without residual deficits: Secondary | ICD-10-CM | POA: Diagnosis not present

## 2022-06-11 DIAGNOSIS — E119 Type 2 diabetes mellitus without complications: Secondary | ICD-10-CM | POA: Diagnosis not present

## 2022-06-12 DIAGNOSIS — Z8673 Personal history of transient ischemic attack (TIA), and cerebral infarction without residual deficits: Secondary | ICD-10-CM | POA: Diagnosis not present

## 2022-06-12 DIAGNOSIS — I10 Essential (primary) hypertension: Secondary | ICD-10-CM | POA: Diagnosis not present

## 2022-06-12 DIAGNOSIS — E119 Type 2 diabetes mellitus without complications: Secondary | ICD-10-CM | POA: Diagnosis not present

## 2022-06-12 DIAGNOSIS — R7989 Other specified abnormal findings of blood chemistry: Secondary | ICD-10-CM | POA: Diagnosis not present

## 2022-06-12 IMAGING — US US ABDOMEN COMPLETE
1 series · 14 of 25 positions shown · non-contrast
Comparison: None

CLINICAL DATA: Epigastric pain for 3 weeks, abdominal pain, and
bloating

EXAM:
ABDOMEN ULTRASOUND COMPLETE

[Series 1: us abdomen complete · 0.25mm/px · 14 of 125 slices shown]
[im 1/125]
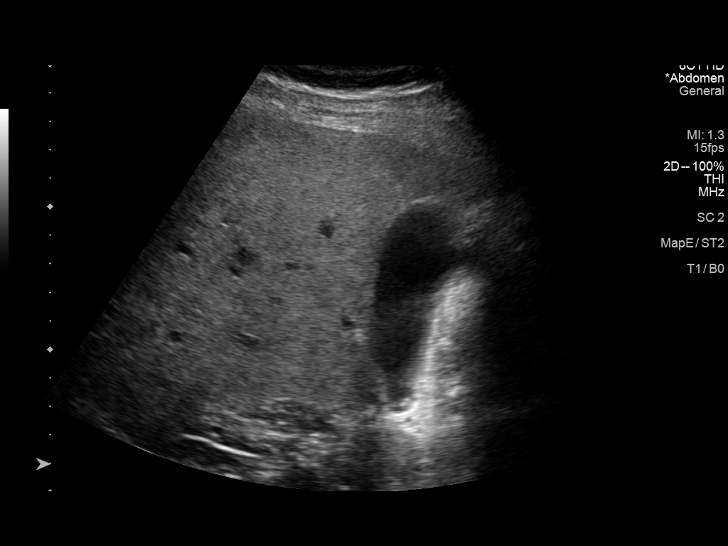
[im 11/125]
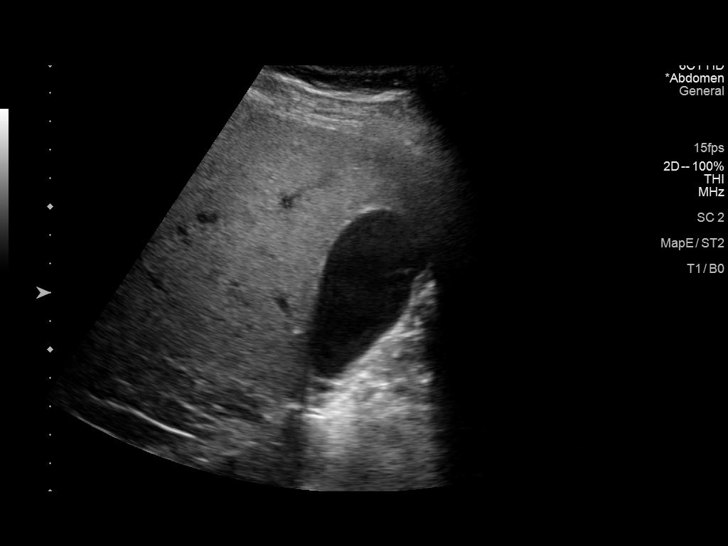
[im 21/125]
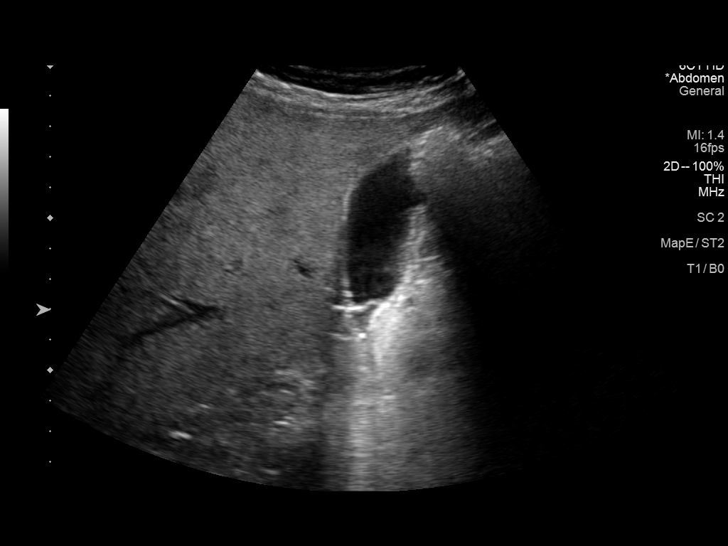
[im 32/125]
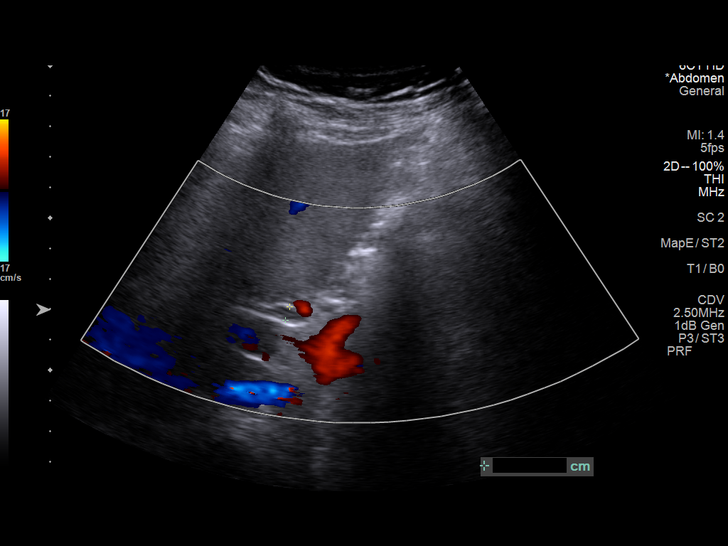
[im 42/125]
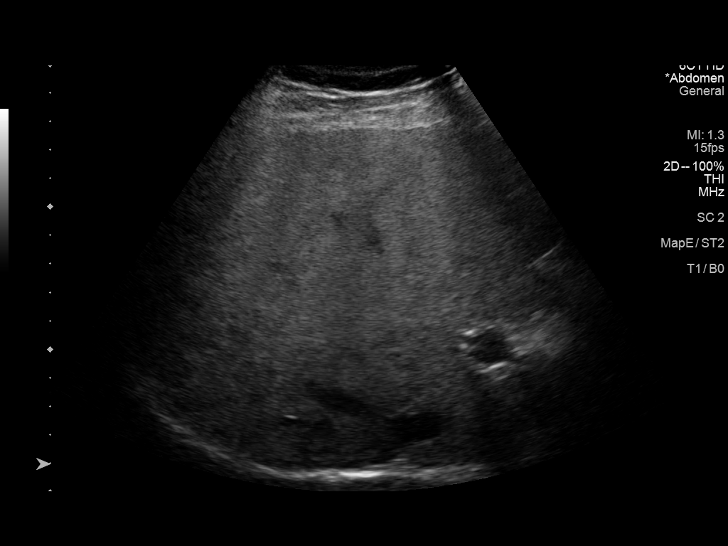
[im 47/125]
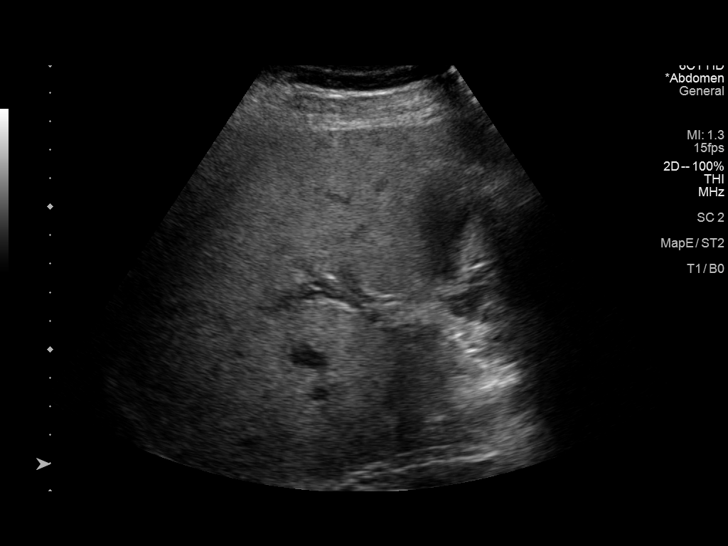
[im 57/125]
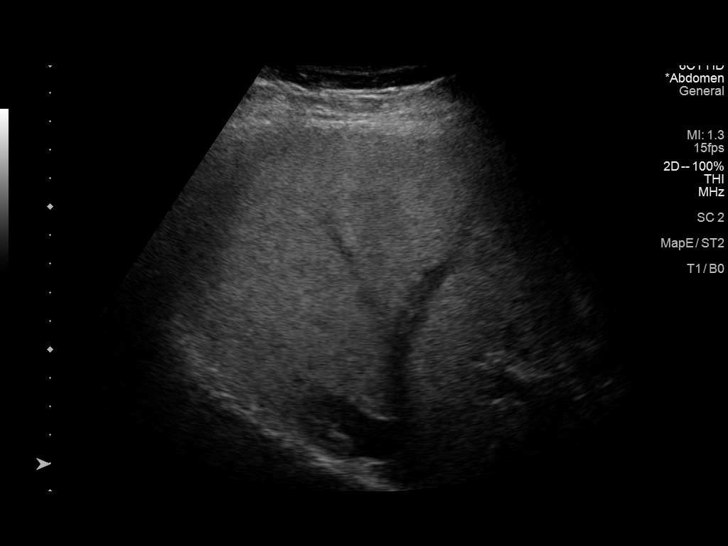
[im 68/125]
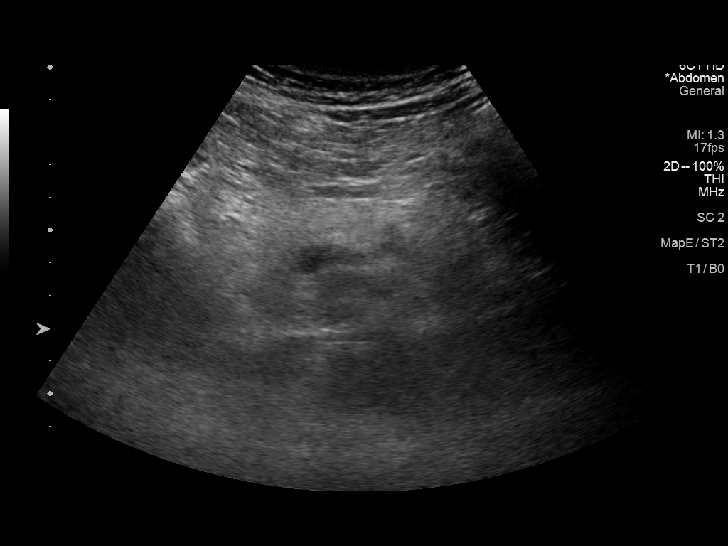
[im 78/125]
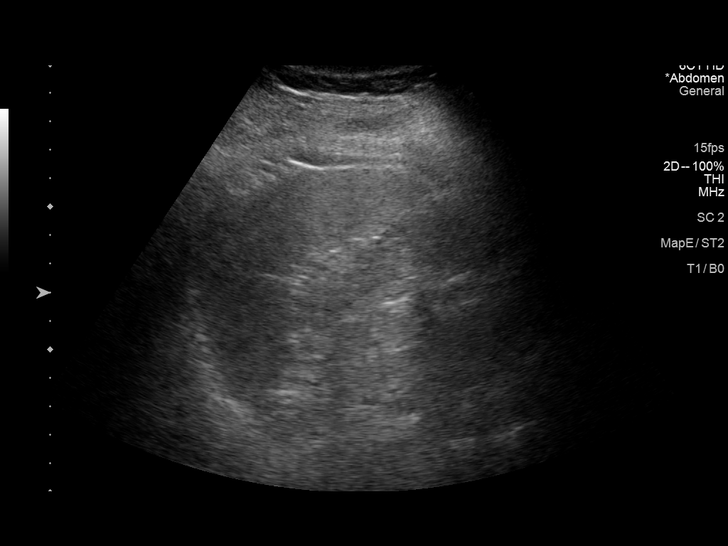
[im 83/125]
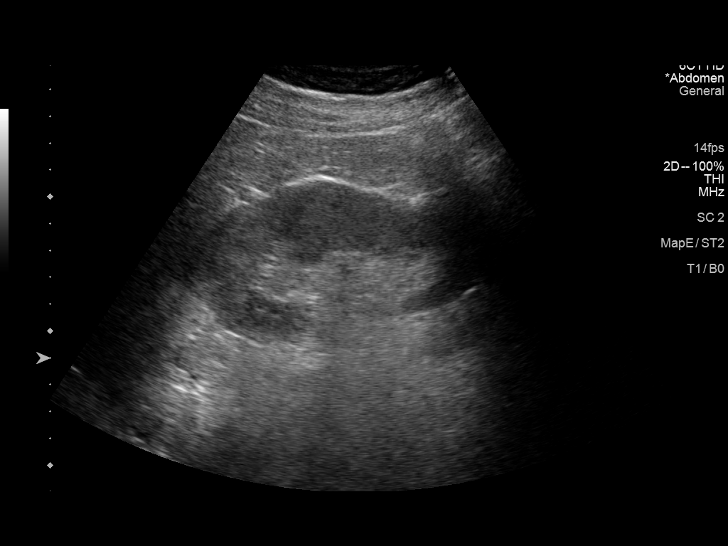
[im 94/125]
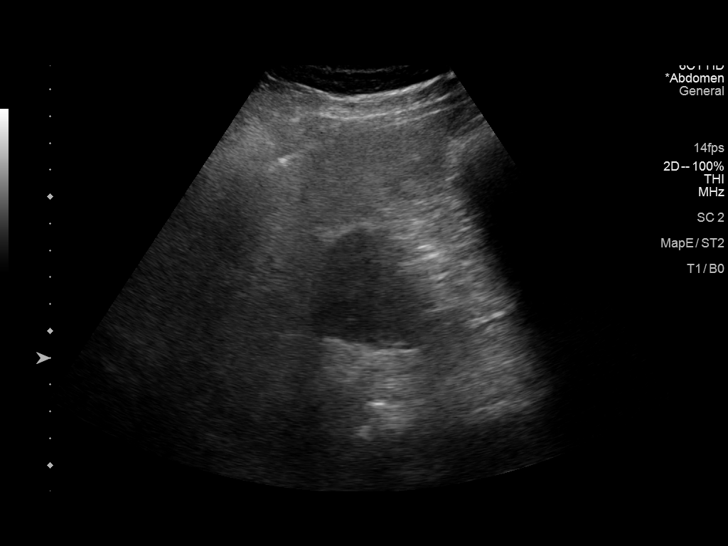
[im 104/125]
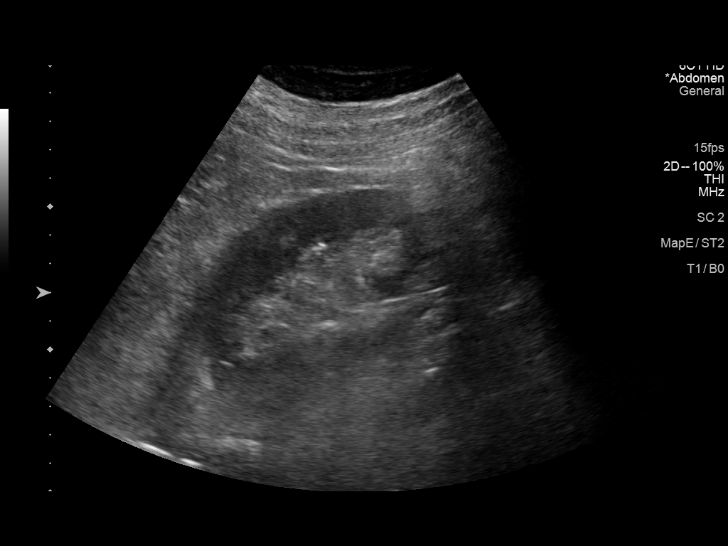
[im 114/125]
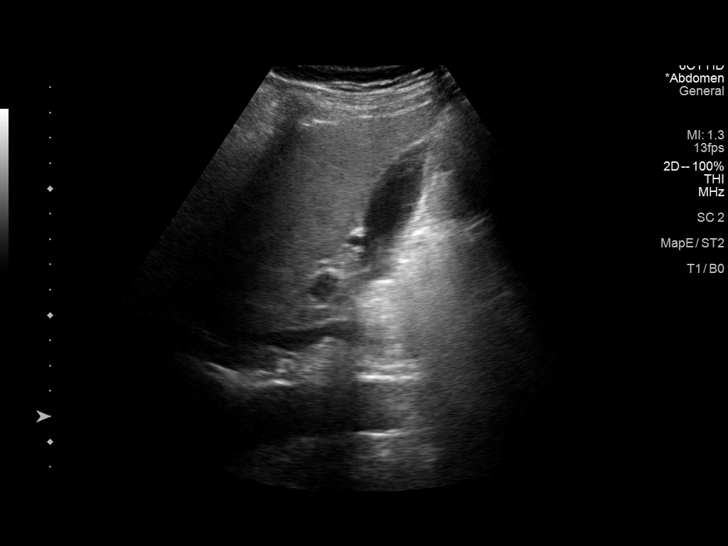
[im 125/125]
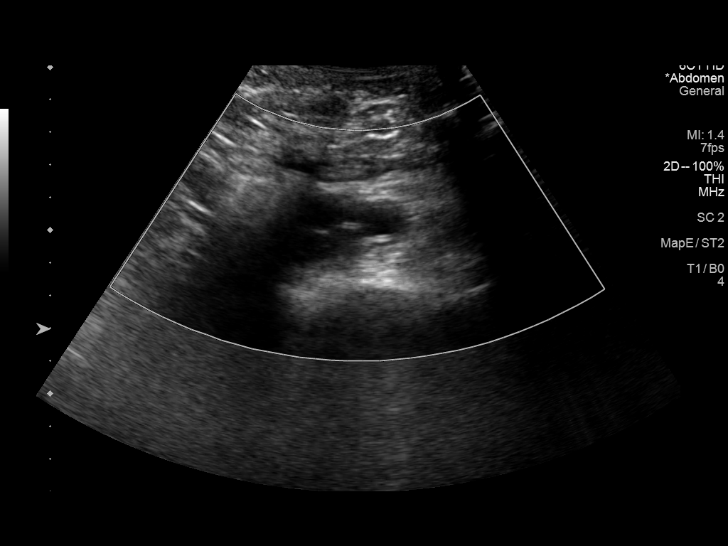

[14 of 25 positions shown; findings below may reference images not displayed]

FINDINGS: Gallbladder: Normally distended with sludge at lower gallbladder
segment. No gallbladder wall thickening or definite shadowing
calculi. No pericholecystic fluid or sonographic Murphy sign.

Common bile duct: Diameter: 5 mm, normal

Liver: Echogenic parenchyma, likely fatty infiltration though this
can be seen with cirrhosis and certain infiltrative disorders. No
focal hepatic mass or nodularity. Portal vein is patent on color
Doppler imaging with normal direction of blood flow towards the
liver.

IVC: Normal appearance

Pancreas: Normal appearance

Spleen: Normal appearance, 8.6 cm length

Right Kidney: Length: 11.3 cm. Normal morphology without mass or
hydronephrosis.

Left Kidney: Length: 12.4 cm. Normal morphology without mass or
hydronephrosis.

Abdominal aorta: Normal caliber

Other findings: No free fluid
IMPRESSION: Small amount of sludge at lower gallbladder segment.

Remainder of exam unremarkable.

## 2022-06-13 DIAGNOSIS — R7989 Other specified abnormal findings of blood chemistry: Secondary | ICD-10-CM | POA: Diagnosis not present

## 2022-06-13 DIAGNOSIS — E119 Type 2 diabetes mellitus without complications: Secondary | ICD-10-CM | POA: Diagnosis not present

## 2022-06-13 DIAGNOSIS — R0602 Shortness of breath: Secondary | ICD-10-CM | POA: Diagnosis not present

## 2022-06-13 DIAGNOSIS — I10 Essential (primary) hypertension: Secondary | ICD-10-CM | POA: Diagnosis not present

## 2022-06-13 DIAGNOSIS — Z8673 Personal history of transient ischemic attack (TIA), and cerebral infarction without residual deficits: Secondary | ICD-10-CM | POA: Diagnosis not present

## 2022-06-14 DIAGNOSIS — E119 Type 2 diabetes mellitus without complications: Secondary | ICD-10-CM | POA: Diagnosis not present

## 2022-06-14 DIAGNOSIS — I10 Essential (primary) hypertension: Secondary | ICD-10-CM | POA: Diagnosis not present

## 2022-06-14 DIAGNOSIS — Z8673 Personal history of transient ischemic attack (TIA), and cerebral infarction without residual deficits: Secondary | ICD-10-CM | POA: Diagnosis not present

## 2022-06-14 DIAGNOSIS — R7989 Other specified abnormal findings of blood chemistry: Secondary | ICD-10-CM | POA: Diagnosis not present

## 2022-06-16 DIAGNOSIS — R9431 Abnormal electrocardiogram [ECG] [EKG]: Secondary | ICD-10-CM | POA: Diagnosis not present

## 2022-06-16 DIAGNOSIS — I10 Essential (primary) hypertension: Secondary | ICD-10-CM | POA: Diagnosis not present

## 2022-06-16 DIAGNOSIS — E785 Hyperlipidemia, unspecified: Secondary | ICD-10-CM | POA: Diagnosis not present

## 2022-06-23 DIAGNOSIS — I10 Essential (primary) hypertension: Secondary | ICD-10-CM | POA: Diagnosis not present

## 2022-06-23 DIAGNOSIS — I350 Nonrheumatic aortic (valve) stenosis: Secondary | ICD-10-CM | POA: Diagnosis not present

## 2022-06-23 DIAGNOSIS — I2510 Atherosclerotic heart disease of native coronary artery without angina pectoris: Secondary | ICD-10-CM | POA: Diagnosis not present

## 2022-06-23 DIAGNOSIS — E785 Hyperlipidemia, unspecified: Secondary | ICD-10-CM | POA: Diagnosis not present

## 2022-06-23 DIAGNOSIS — I214 Non-ST elevation (NSTEMI) myocardial infarction: Secondary | ICD-10-CM | POA: Diagnosis not present

## 2022-06-30 DIAGNOSIS — I2510 Atherosclerotic heart disease of native coronary artery without angina pectoris: Secondary | ICD-10-CM | POA: Diagnosis not present

## 2022-06-30 DIAGNOSIS — Z7982 Long term (current) use of aspirin: Secondary | ICD-10-CM | POA: Diagnosis not present

## 2022-06-30 DIAGNOSIS — E119 Type 2 diabetes mellitus without complications: Secondary | ICD-10-CM | POA: Diagnosis not present

## 2022-06-30 DIAGNOSIS — I517 Cardiomegaly: Secondary | ICD-10-CM | POA: Diagnosis not present

## 2022-06-30 DIAGNOSIS — I2582 Chronic total occlusion of coronary artery: Secondary | ICD-10-CM | POA: Diagnosis not present

## 2022-06-30 DIAGNOSIS — I10 Essential (primary) hypertension: Secondary | ICD-10-CM | POA: Diagnosis not present

## 2022-06-30 DIAGNOSIS — Z79899 Other long term (current) drug therapy: Secondary | ICD-10-CM | POA: Diagnosis not present

## 2022-06-30 DIAGNOSIS — I5189 Other ill-defined heart diseases: Secondary | ICD-10-CM | POA: Diagnosis not present

## 2022-06-30 DIAGNOSIS — I350 Nonrheumatic aortic (valve) stenosis: Secondary | ICD-10-CM | POA: Diagnosis not present

## 2022-06-30 DIAGNOSIS — I252 Old myocardial infarction: Secondary | ICD-10-CM | POA: Diagnosis not present

## 2022-06-30 DIAGNOSIS — I25119 Atherosclerotic heart disease of native coronary artery with unspecified angina pectoris: Secondary | ICD-10-CM | POA: Diagnosis not present

## 2022-06-30 DIAGNOSIS — E785 Hyperlipidemia, unspecified: Secondary | ICD-10-CM | POA: Diagnosis not present

## 2022-06-30 DIAGNOSIS — Z7984 Long term (current) use of oral hypoglycemic drugs: Secondary | ICD-10-CM | POA: Diagnosis not present

## 2022-07-03 DIAGNOSIS — Z87891 Personal history of nicotine dependence: Secondary | ICD-10-CM | POA: Diagnosis not present

## 2022-07-03 DIAGNOSIS — H9222 Otorrhagia, left ear: Secondary | ICD-10-CM | POA: Diagnosis not present

## 2022-07-03 DIAGNOSIS — Z7982 Long term (current) use of aspirin: Secondary | ICD-10-CM | POA: Diagnosis not present

## 2022-07-03 DIAGNOSIS — Z881 Allergy status to other antibiotic agents status: Secondary | ICD-10-CM | POA: Diagnosis not present

## 2022-07-03 DIAGNOSIS — Z7902 Long term (current) use of antithrombotics/antiplatelets: Secondary | ICD-10-CM | POA: Diagnosis not present

## 2022-07-03 DIAGNOSIS — Z888 Allergy status to other drugs, medicaments and biological substances status: Secondary | ICD-10-CM | POA: Diagnosis not present

## 2022-07-03 DIAGNOSIS — Z79899 Other long term (current) drug therapy: Secondary | ICD-10-CM | POA: Diagnosis not present

## 2022-07-03 DIAGNOSIS — Z880 Allergy status to penicillin: Secondary | ICD-10-CM | POA: Diagnosis not present

## 2022-07-03 DIAGNOSIS — E119 Type 2 diabetes mellitus without complications: Secondary | ICD-10-CM | POA: Diagnosis not present

## 2022-07-03 DIAGNOSIS — I10 Essential (primary) hypertension: Secondary | ICD-10-CM | POA: Diagnosis not present

## 2022-07-03 DIAGNOSIS — Z8673 Personal history of transient ischemic attack (TIA), and cerebral infarction without residual deficits: Secondary | ICD-10-CM | POA: Diagnosis not present

## 2022-07-03 DIAGNOSIS — Z7984 Long term (current) use of oral hypoglycemic drugs: Secondary | ICD-10-CM | POA: Diagnosis not present

## 2022-07-03 DIAGNOSIS — J45909 Unspecified asthma, uncomplicated: Secondary | ICD-10-CM | POA: Diagnosis not present

## 2022-07-03 DIAGNOSIS — E785 Hyperlipidemia, unspecified: Secondary | ICD-10-CM | POA: Diagnosis not present

## 2022-07-07 DIAGNOSIS — M79671 Pain in right foot: Secondary | ICD-10-CM | POA: Diagnosis not present

## 2022-07-07 DIAGNOSIS — B351 Tinea unguium: Secondary | ICD-10-CM | POA: Diagnosis not present

## 2022-07-07 DIAGNOSIS — L84 Corns and callosities: Secondary | ICD-10-CM | POA: Diagnosis not present

## 2022-07-07 DIAGNOSIS — E1159 Type 2 diabetes mellitus with other circulatory complications: Secondary | ICD-10-CM | POA: Diagnosis not present

## 2022-07-07 DIAGNOSIS — M79672 Pain in left foot: Secondary | ICD-10-CM | POA: Diagnosis not present

## 2022-07-09 DIAGNOSIS — M792 Neuralgia and neuritis, unspecified: Secondary | ICD-10-CM | POA: Diagnosis not present

## 2022-07-09 DIAGNOSIS — N1831 Chronic kidney disease, stage 3a: Secondary | ICD-10-CM | POA: Diagnosis not present

## 2022-07-09 DIAGNOSIS — R202 Paresthesia of skin: Secondary | ICD-10-CM | POA: Diagnosis not present

## 2022-07-09 DIAGNOSIS — H6992 Unspecified Eustachian tube disorder, left ear: Secondary | ICD-10-CM | POA: Diagnosis not present

## 2022-07-09 DIAGNOSIS — Z6832 Body mass index (BMI) 32.0-32.9, adult: Secondary | ICD-10-CM | POA: Diagnosis not present

## 2022-07-09 DIAGNOSIS — E782 Mixed hyperlipidemia: Secondary | ICD-10-CM | POA: Diagnosis not present

## 2022-07-09 DIAGNOSIS — E1165 Type 2 diabetes mellitus with hyperglycemia: Secondary | ICD-10-CM | POA: Diagnosis not present

## 2022-07-09 DIAGNOSIS — I10 Essential (primary) hypertension: Secondary | ICD-10-CM | POA: Diagnosis not present

## 2022-07-09 DIAGNOSIS — E1159 Type 2 diabetes mellitus with other circulatory complications: Secondary | ICD-10-CM | POA: Diagnosis not present

## 2022-07-09 DIAGNOSIS — N481 Balanitis: Secondary | ICD-10-CM | POA: Diagnosis not present

## 2022-07-14 DIAGNOSIS — I25119 Atherosclerotic heart disease of native coronary artery with unspecified angina pectoris: Secondary | ICD-10-CM | POA: Diagnosis not present

## 2022-07-14 DIAGNOSIS — E785 Hyperlipidemia, unspecified: Secondary | ICD-10-CM | POA: Diagnosis not present

## 2022-07-14 DIAGNOSIS — Z7982 Long term (current) use of aspirin: Secondary | ICD-10-CM | POA: Diagnosis not present

## 2022-07-14 DIAGNOSIS — I350 Nonrheumatic aortic (valve) stenosis: Secondary | ICD-10-CM | POA: Diagnosis not present

## 2022-07-14 DIAGNOSIS — I10 Essential (primary) hypertension: Secondary | ICD-10-CM | POA: Diagnosis not present

## 2022-07-14 DIAGNOSIS — I2510 Atherosclerotic heart disease of native coronary artery without angina pectoris: Secondary | ICD-10-CM | POA: Diagnosis not present

## 2022-07-14 DIAGNOSIS — I2582 Chronic total occlusion of coronary artery: Secondary | ICD-10-CM | POA: Diagnosis not present

## 2022-07-14 DIAGNOSIS — I214 Non-ST elevation (NSTEMI) myocardial infarction: Secondary | ICD-10-CM | POA: Diagnosis not present

## 2022-07-14 DIAGNOSIS — Z79899 Other long term (current) drug therapy: Secondary | ICD-10-CM | POA: Diagnosis not present

## 2022-07-14 DIAGNOSIS — Z7984 Long term (current) use of oral hypoglycemic drugs: Secondary | ICD-10-CM | POA: Diagnosis not present

## 2022-07-14 DIAGNOSIS — I25110 Atherosclerotic heart disease of native coronary artery with unstable angina pectoris: Secondary | ICD-10-CM | POA: Diagnosis not present

## 2022-07-21 DIAGNOSIS — I083 Combined rheumatic disorders of mitral, aortic and tricuspid valves: Secondary | ICD-10-CM | POA: Diagnosis not present

## 2022-08-06 DIAGNOSIS — Z955 Presence of coronary angioplasty implant and graft: Secondary | ICD-10-CM | POA: Diagnosis not present

## 2022-08-10 DIAGNOSIS — H2513 Age-related nuclear cataract, bilateral: Secondary | ICD-10-CM | POA: Diagnosis not present

## 2022-08-10 DIAGNOSIS — H25013 Cortical age-related cataract, bilateral: Secondary | ICD-10-CM | POA: Diagnosis not present

## 2022-08-10 DIAGNOSIS — H401132 Primary open-angle glaucoma, bilateral, moderate stage: Secondary | ICD-10-CM | POA: Diagnosis not present

## 2022-08-10 DIAGNOSIS — E119 Type 2 diabetes mellitus without complications: Secondary | ICD-10-CM | POA: Diagnosis not present

## 2022-08-12 DIAGNOSIS — Z955 Presence of coronary angioplasty implant and graft: Secondary | ICD-10-CM | POA: Diagnosis not present

## 2022-08-13 DIAGNOSIS — Z955 Presence of coronary angioplasty implant and graft: Secondary | ICD-10-CM | POA: Diagnosis not present

## 2022-08-17 DIAGNOSIS — Z955 Presence of coronary angioplasty implant and graft: Secondary | ICD-10-CM | POA: Diagnosis not present

## 2022-08-19 DIAGNOSIS — Z955 Presence of coronary angioplasty implant and graft: Secondary | ICD-10-CM | POA: Diagnosis not present

## 2022-08-20 DIAGNOSIS — Z955 Presence of coronary angioplasty implant and graft: Secondary | ICD-10-CM | POA: Diagnosis not present

## 2022-08-24 DIAGNOSIS — Z955 Presence of coronary angioplasty implant and graft: Secondary | ICD-10-CM | POA: Diagnosis not present

## 2022-08-26 DIAGNOSIS — Z955 Presence of coronary angioplasty implant and graft: Secondary | ICD-10-CM | POA: Diagnosis not present

## 2022-08-27 DIAGNOSIS — Z955 Presence of coronary angioplasty implant and graft: Secondary | ICD-10-CM | POA: Diagnosis not present

## 2022-08-28 DIAGNOSIS — I2510 Atherosclerotic heart disease of native coronary artery without angina pectoris: Secondary | ICD-10-CM | POA: Diagnosis not present

## 2022-08-28 DIAGNOSIS — R911 Solitary pulmonary nodule: Secondary | ICD-10-CM | POA: Diagnosis not present

## 2022-08-31 DIAGNOSIS — Z955 Presence of coronary angioplasty implant and graft: Secondary | ICD-10-CM | POA: Diagnosis not present

## 2022-09-02 DIAGNOSIS — Z955 Presence of coronary angioplasty implant and graft: Secondary | ICD-10-CM | POA: Diagnosis not present

## 2022-09-03 DIAGNOSIS — Z955 Presence of coronary angioplasty implant and graft: Secondary | ICD-10-CM | POA: Diagnosis not present

## 2022-09-10 DIAGNOSIS — R69 Illness, unspecified: Secondary | ICD-10-CM | POA: Diagnosis not present

## 2022-09-14 DIAGNOSIS — Z955 Presence of coronary angioplasty implant and graft: Secondary | ICD-10-CM | POA: Diagnosis not present

## 2022-09-15 DIAGNOSIS — L84 Corns and callosities: Secondary | ICD-10-CM | POA: Diagnosis not present

## 2022-09-15 DIAGNOSIS — B351 Tinea unguium: Secondary | ICD-10-CM | POA: Diagnosis not present

## 2022-09-15 DIAGNOSIS — M79671 Pain in right foot: Secondary | ICD-10-CM | POA: Diagnosis not present

## 2022-09-15 DIAGNOSIS — M79672 Pain in left foot: Secondary | ICD-10-CM | POA: Diagnosis not present

## 2022-09-15 DIAGNOSIS — E1159 Type 2 diabetes mellitus with other circulatory complications: Secondary | ICD-10-CM | POA: Diagnosis not present

## 2022-09-17 DIAGNOSIS — I2510 Atherosclerotic heart disease of native coronary artery without angina pectoris: Secondary | ICD-10-CM | POA: Diagnosis not present

## 2022-09-17 DIAGNOSIS — Z955 Presence of coronary angioplasty implant and graft: Secondary | ICD-10-CM | POA: Diagnosis not present

## 2022-09-17 DIAGNOSIS — I240 Acute coronary thrombosis not resulting in myocardial infarction: Secondary | ICD-10-CM | POA: Diagnosis not present

## 2022-09-17 DIAGNOSIS — I4510 Unspecified right bundle-branch block: Secondary | ICD-10-CM | POA: Diagnosis not present

## 2022-09-17 DIAGNOSIS — E785 Hyperlipidemia, unspecified: Secondary | ICD-10-CM | POA: Diagnosis not present

## 2022-09-17 DIAGNOSIS — Z1330 Encounter for screening examination for mental health and behavioral disorders, unspecified: Secondary | ICD-10-CM | POA: Diagnosis not present

## 2022-09-17 DIAGNOSIS — I350 Nonrheumatic aortic (valve) stenosis: Secondary | ICD-10-CM | POA: Diagnosis not present

## 2022-09-17 DIAGNOSIS — I10 Essential (primary) hypertension: Secondary | ICD-10-CM | POA: Diagnosis not present

## 2022-09-21 DIAGNOSIS — Z955 Presence of coronary angioplasty implant and graft: Secondary | ICD-10-CM | POA: Diagnosis not present

## 2022-09-23 DIAGNOSIS — Z955 Presence of coronary angioplasty implant and graft: Secondary | ICD-10-CM | POA: Diagnosis not present

## 2022-09-24 DIAGNOSIS — Z955 Presence of coronary angioplasty implant and graft: Secondary | ICD-10-CM | POA: Diagnosis not present

## 2022-09-28 DIAGNOSIS — Z955 Presence of coronary angioplasty implant and graft: Secondary | ICD-10-CM | POA: Diagnosis not present

## 2022-09-30 DIAGNOSIS — Z955 Presence of coronary angioplasty implant and graft: Secondary | ICD-10-CM | POA: Diagnosis not present

## 2022-10-01 DIAGNOSIS — Z955 Presence of coronary angioplasty implant and graft: Secondary | ICD-10-CM | POA: Diagnosis not present

## 2022-10-05 DIAGNOSIS — Z955 Presence of coronary angioplasty implant and graft: Secondary | ICD-10-CM | POA: Diagnosis not present

## 2022-10-07 DIAGNOSIS — Z955 Presence of coronary angioplasty implant and graft: Secondary | ICD-10-CM | POA: Diagnosis not present

## 2022-10-08 DIAGNOSIS — Z955 Presence of coronary angioplasty implant and graft: Secondary | ICD-10-CM | POA: Diagnosis not present

## 2022-10-12 DIAGNOSIS — Z955 Presence of coronary angioplasty implant and graft: Secondary | ICD-10-CM | POA: Diagnosis not present

## 2022-10-13 DIAGNOSIS — N1831 Chronic kidney disease, stage 3a: Secondary | ICD-10-CM | POA: Diagnosis not present

## 2022-10-13 DIAGNOSIS — E1165 Type 2 diabetes mellitus with hyperglycemia: Secondary | ICD-10-CM | POA: Diagnosis not present

## 2022-10-13 DIAGNOSIS — E782 Mixed hyperlipidemia: Secondary | ICD-10-CM | POA: Diagnosis not present

## 2022-10-13 DIAGNOSIS — I129 Hypertensive chronic kidney disease with stage 1 through stage 4 chronic kidney disease, or unspecified chronic kidney disease: Secondary | ICD-10-CM | POA: Diagnosis not present

## 2022-10-14 DIAGNOSIS — Z955 Presence of coronary angioplasty implant and graft: Secondary | ICD-10-CM | POA: Diagnosis not present

## 2022-10-15 DIAGNOSIS — Z955 Presence of coronary angioplasty implant and graft: Secondary | ICD-10-CM | POA: Diagnosis not present

## 2022-10-16 DIAGNOSIS — I252 Old myocardial infarction: Secondary | ICD-10-CM | POA: Diagnosis not present

## 2022-10-16 DIAGNOSIS — E785 Hyperlipidemia, unspecified: Secondary | ICD-10-CM | POA: Diagnosis not present

## 2022-10-16 DIAGNOSIS — I10 Essential (primary) hypertension: Secondary | ICD-10-CM | POA: Diagnosis not present

## 2022-10-16 DIAGNOSIS — Z87891 Personal history of nicotine dependence: Secondary | ICD-10-CM | POA: Diagnosis not present

## 2022-10-16 DIAGNOSIS — E1136 Type 2 diabetes mellitus with diabetic cataract: Secondary | ICD-10-CM | POA: Diagnosis not present

## 2022-10-16 DIAGNOSIS — Z7902 Long term (current) use of antithrombotics/antiplatelets: Secondary | ICD-10-CM | POA: Diagnosis not present

## 2022-10-16 DIAGNOSIS — Z8249 Family history of ischemic heart disease and other diseases of the circulatory system: Secondary | ICD-10-CM | POA: Diagnosis not present

## 2022-10-16 DIAGNOSIS — E669 Obesity, unspecified: Secondary | ICD-10-CM | POA: Diagnosis not present

## 2022-10-16 DIAGNOSIS — H259 Unspecified age-related cataract: Secondary | ICD-10-CM | POA: Diagnosis not present

## 2022-10-16 DIAGNOSIS — Z008 Encounter for other general examination: Secondary | ICD-10-CM | POA: Diagnosis not present

## 2022-10-16 DIAGNOSIS — Z7984 Long term (current) use of oral hypoglycemic drugs: Secondary | ICD-10-CM | POA: Diagnosis not present

## 2022-10-16 DIAGNOSIS — I25119 Atherosclerotic heart disease of native coronary artery with unspecified angina pectoris: Secondary | ICD-10-CM | POA: Diagnosis not present

## 2022-10-16 DIAGNOSIS — R69 Illness, unspecified: Secondary | ICD-10-CM | POA: Diagnosis not present

## 2022-10-19 DIAGNOSIS — Z955 Presence of coronary angioplasty implant and graft: Secondary | ICD-10-CM | POA: Diagnosis not present

## 2022-10-21 DIAGNOSIS — Z955 Presence of coronary angioplasty implant and graft: Secondary | ICD-10-CM | POA: Diagnosis not present

## 2022-10-22 DIAGNOSIS — Z955 Presence of coronary angioplasty implant and graft: Secondary | ICD-10-CM | POA: Diagnosis not present

## 2022-10-26 DIAGNOSIS — Z955 Presence of coronary angioplasty implant and graft: Secondary | ICD-10-CM | POA: Diagnosis not present

## 2022-10-29 DIAGNOSIS — Z955 Presence of coronary angioplasty implant and graft: Secondary | ICD-10-CM | POA: Diagnosis not present

## 2022-11-02 DIAGNOSIS — Z955 Presence of coronary angioplasty implant and graft: Secondary | ICD-10-CM | POA: Diagnosis not present

## 2022-11-03 DIAGNOSIS — M792 Neuralgia and neuritis, unspecified: Secondary | ICD-10-CM | POA: Diagnosis not present

## 2022-11-03 DIAGNOSIS — Z6832 Body mass index (BMI) 32.0-32.9, adult: Secondary | ICD-10-CM | POA: Diagnosis not present

## 2022-11-03 DIAGNOSIS — E1159 Type 2 diabetes mellitus with other circulatory complications: Secondary | ICD-10-CM | POA: Diagnosis not present

## 2022-11-03 DIAGNOSIS — E782 Mixed hyperlipidemia: Secondary | ICD-10-CM | POA: Diagnosis not present

## 2022-11-03 DIAGNOSIS — N1831 Chronic kidney disease, stage 3a: Secondary | ICD-10-CM | POA: Diagnosis not present

## 2022-11-03 DIAGNOSIS — R202 Paresthesia of skin: Secondary | ICD-10-CM | POA: Diagnosis not present

## 2022-11-04 DIAGNOSIS — Z955 Presence of coronary angioplasty implant and graft: Secondary | ICD-10-CM | POA: Diagnosis not present

## 2022-11-05 DIAGNOSIS — Z955 Presence of coronary angioplasty implant and graft: Secondary | ICD-10-CM | POA: Diagnosis not present

## 2022-11-09 DIAGNOSIS — Z955 Presence of coronary angioplasty implant and graft: Secondary | ICD-10-CM | POA: Diagnosis not present

## 2022-11-11 DIAGNOSIS — Z955 Presence of coronary angioplasty implant and graft: Secondary | ICD-10-CM | POA: Diagnosis not present

## 2022-11-16 DIAGNOSIS — M79671 Pain in right foot: Secondary | ICD-10-CM | POA: Diagnosis not present

## 2022-11-16 DIAGNOSIS — M79672 Pain in left foot: Secondary | ICD-10-CM | POA: Diagnosis not present

## 2022-11-16 DIAGNOSIS — B351 Tinea unguium: Secondary | ICD-10-CM | POA: Diagnosis not present

## 2022-11-16 DIAGNOSIS — L84 Corns and callosities: Secondary | ICD-10-CM | POA: Diagnosis not present

## 2022-11-16 DIAGNOSIS — E1159 Type 2 diabetes mellitus with other circulatory complications: Secondary | ICD-10-CM | POA: Diagnosis not present

## 2022-12-14 DIAGNOSIS — Z125 Encounter for screening for malignant neoplasm of prostate: Secondary | ICD-10-CM | POA: Diagnosis not present

## 2022-12-14 DIAGNOSIS — N5201 Erectile dysfunction due to arterial insufficiency: Secondary | ICD-10-CM | POA: Diagnosis not present

## 2023-02-12 DIAGNOSIS — R202 Paresthesia of skin: Secondary | ICD-10-CM | POA: Diagnosis not present

## 2023-02-12 DIAGNOSIS — R5383 Other fatigue: Secondary | ICD-10-CM | POA: Diagnosis not present

## 2023-02-12 DIAGNOSIS — N1831 Chronic kidney disease, stage 3a: Secondary | ICD-10-CM | POA: Diagnosis not present

## 2023-02-12 DIAGNOSIS — E1165 Type 2 diabetes mellitus with hyperglycemia: Secondary | ICD-10-CM | POA: Diagnosis not present

## 2023-02-12 DIAGNOSIS — E782 Mixed hyperlipidemia: Secondary | ICD-10-CM | POA: Diagnosis not present

## 2023-02-12 DIAGNOSIS — E1159 Type 2 diabetes mellitus with other circulatory complications: Secondary | ICD-10-CM | POA: Diagnosis not present

## 2023-02-12 DIAGNOSIS — M792 Neuralgia and neuritis, unspecified: Secondary | ICD-10-CM | POA: Diagnosis not present

## 2023-02-12 DIAGNOSIS — Z6832 Body mass index (BMI) 32.0-32.9, adult: Secondary | ICD-10-CM | POA: Diagnosis not present

## 2023-02-19 DIAGNOSIS — E782 Mixed hyperlipidemia: Secondary | ICD-10-CM | POA: Diagnosis not present

## 2023-02-19 DIAGNOSIS — I63412 Cerebral infarction due to embolism of left middle cerebral artery: Secondary | ICD-10-CM | POA: Diagnosis not present

## 2023-02-19 DIAGNOSIS — N1831 Chronic kidney disease, stage 3a: Secondary | ICD-10-CM | POA: Diagnosis not present

## 2023-02-19 DIAGNOSIS — I129 Hypertensive chronic kidney disease with stage 1 through stage 4 chronic kidney disease, or unspecified chronic kidney disease: Secondary | ICD-10-CM | POA: Diagnosis not present

## 2023-02-19 DIAGNOSIS — E1165 Type 2 diabetes mellitus with hyperglycemia: Secondary | ICD-10-CM | POA: Diagnosis not present

## 2023-03-01 DIAGNOSIS — R911 Solitary pulmonary nodule: Secondary | ICD-10-CM | POA: Diagnosis not present

## 2023-03-01 DIAGNOSIS — M792 Neuralgia and neuritis, unspecified: Secondary | ICD-10-CM | POA: Diagnosis not present

## 2023-03-01 DIAGNOSIS — R202 Paresthesia of skin: Secondary | ICD-10-CM | POA: Diagnosis not present

## 2023-03-01 DIAGNOSIS — H6120 Impacted cerumen, unspecified ear: Secondary | ICD-10-CM | POA: Diagnosis not present

## 2023-03-01 DIAGNOSIS — E782 Mixed hyperlipidemia: Secondary | ICD-10-CM | POA: Diagnosis not present

## 2023-03-01 DIAGNOSIS — Z0000 Encounter for general adult medical examination without abnormal findings: Secondary | ICD-10-CM | POA: Diagnosis not present

## 2023-03-01 DIAGNOSIS — N1831 Chronic kidney disease, stage 3a: Secondary | ICD-10-CM | POA: Diagnosis not present

## 2023-03-01 DIAGNOSIS — E1159 Type 2 diabetes mellitus with other circulatory complications: Secondary | ICD-10-CM | POA: Diagnosis not present

## 2023-03-01 DIAGNOSIS — Z6832 Body mass index (BMI) 32.0-32.9, adult: Secondary | ICD-10-CM | POA: Diagnosis not present

## 2023-03-18 DIAGNOSIS — I4510 Unspecified right bundle-branch block: Secondary | ICD-10-CM | POA: Diagnosis not present

## 2023-03-18 DIAGNOSIS — Z955 Presence of coronary angioplasty implant and graft: Secondary | ICD-10-CM | POA: Diagnosis not present

## 2023-03-18 DIAGNOSIS — I10 Essential (primary) hypertension: Secondary | ICD-10-CM | POA: Diagnosis not present

## 2023-03-18 DIAGNOSIS — I350 Nonrheumatic aortic (valve) stenosis: Secondary | ICD-10-CM | POA: Diagnosis not present

## 2023-03-18 DIAGNOSIS — E785 Hyperlipidemia, unspecified: Secondary | ICD-10-CM | POA: Diagnosis not present

## 2023-03-18 DIAGNOSIS — I2510 Atherosclerotic heart disease of native coronary artery without angina pectoris: Secondary | ICD-10-CM | POA: Diagnosis not present

## 2023-03-22 DIAGNOSIS — B351 Tinea unguium: Secondary | ICD-10-CM | POA: Diagnosis not present

## 2023-03-22 DIAGNOSIS — M79671 Pain in right foot: Secondary | ICD-10-CM | POA: Diagnosis not present

## 2023-03-22 DIAGNOSIS — E1159 Type 2 diabetes mellitus with other circulatory complications: Secondary | ICD-10-CM | POA: Diagnosis not present

## 2023-03-22 DIAGNOSIS — L84 Corns and callosities: Secondary | ICD-10-CM | POA: Diagnosis not present

## 2023-03-22 DIAGNOSIS — M79672 Pain in left foot: Secondary | ICD-10-CM | POA: Diagnosis not present

## 2023-04-08 DIAGNOSIS — R911 Solitary pulmonary nodule: Secondary | ICD-10-CM | POA: Diagnosis not present

## 2023-04-08 DIAGNOSIS — I2510 Atherosclerotic heart disease of native coronary artery without angina pectoris: Secondary | ICD-10-CM | POA: Diagnosis not present

## 2023-04-20 DIAGNOSIS — R911 Solitary pulmonary nodule: Secondary | ICD-10-CM | POA: Diagnosis not present

## 2023-04-20 DIAGNOSIS — I10 Essential (primary) hypertension: Secondary | ICD-10-CM | POA: Diagnosis not present

## 2023-04-20 DIAGNOSIS — Z77090 Contact with and (suspected) exposure to asbestos: Secondary | ICD-10-CM | POA: Diagnosis not present

## 2023-04-20 DIAGNOSIS — F17211 Nicotine dependence, cigarettes, in remission: Secondary | ICD-10-CM | POA: Diagnosis not present

## 2023-05-11 DIAGNOSIS — R5383 Other fatigue: Secondary | ICD-10-CM | POA: Diagnosis not present

## 2023-05-17 DIAGNOSIS — E1165 Type 2 diabetes mellitus with hyperglycemia: Secondary | ICD-10-CM | POA: Diagnosis not present

## 2023-05-17 DIAGNOSIS — Z23 Encounter for immunization: Secondary | ICD-10-CM | POA: Diagnosis not present

## 2023-05-17 DIAGNOSIS — E782 Mixed hyperlipidemia: Secondary | ICD-10-CM | POA: Diagnosis not present

## 2023-05-17 DIAGNOSIS — N1831 Chronic kidney disease, stage 3a: Secondary | ICD-10-CM | POA: Diagnosis not present

## 2023-05-21 DIAGNOSIS — E1165 Type 2 diabetes mellitus with hyperglycemia: Secondary | ICD-10-CM | POA: Diagnosis not present

## 2023-05-21 DIAGNOSIS — Z23 Encounter for immunization: Secondary | ICD-10-CM | POA: Diagnosis not present

## 2023-05-24 DIAGNOSIS — B351 Tinea unguium: Secondary | ICD-10-CM | POA: Diagnosis not present

## 2023-05-24 DIAGNOSIS — E1159 Type 2 diabetes mellitus with other circulatory complications: Secondary | ICD-10-CM | POA: Diagnosis not present

## 2023-05-24 DIAGNOSIS — L84 Corns and callosities: Secondary | ICD-10-CM | POA: Diagnosis not present

## 2023-05-25 DIAGNOSIS — E1165 Type 2 diabetes mellitus with hyperglycemia: Secondary | ICD-10-CM | POA: Diagnosis not present

## 2023-06-14 DIAGNOSIS — E1165 Type 2 diabetes mellitus with hyperglycemia: Secondary | ICD-10-CM | POA: Diagnosis not present

## 2023-06-21 DIAGNOSIS — I129 Hypertensive chronic kidney disease with stage 1 through stage 4 chronic kidney disease, or unspecified chronic kidney disease: Secondary | ICD-10-CM | POA: Diagnosis not present

## 2023-06-21 DIAGNOSIS — I63412 Cerebral infarction due to embolism of left middle cerebral artery: Secondary | ICD-10-CM | POA: Diagnosis not present

## 2023-06-21 DIAGNOSIS — E1165 Type 2 diabetes mellitus with hyperglycemia: Secondary | ICD-10-CM | POA: Diagnosis not present

## 2023-06-21 DIAGNOSIS — N1831 Chronic kidney disease, stage 3a: Secondary | ICD-10-CM | POA: Diagnosis not present

## 2023-06-21 DIAGNOSIS — E1159 Type 2 diabetes mellitus with other circulatory complications: Secondary | ICD-10-CM | POA: Diagnosis not present

## 2023-07-09 ENCOUNTER — Other Ambulatory Visit (HOSPITAL_COMMUNITY): Payer: Self-pay

## 2023-07-10 ENCOUNTER — Other Ambulatory Visit (HOSPITAL_COMMUNITY): Payer: Self-pay

## 2023-07-15 ENCOUNTER — Other Ambulatory Visit (HOSPITAL_COMMUNITY): Payer: Self-pay

## 2023-07-15 ENCOUNTER — Other Ambulatory Visit (HOSPITAL_BASED_OUTPATIENT_CLINIC_OR_DEPARTMENT_OTHER): Payer: Self-pay

## 2023-07-15 MED ORDER — DEXCOM G6 TRANSMITTER MISC: 1.0000 | 3 refills | Status: AC

## 2023-07-15 MED ORDER — INSULIN PEN NEEDLE 32G X 4 MM MISC
1.0000 | Freq: Every day | 3 refills | Status: AC
  Filled 2023-08-18: qty 100, 100d supply, fill #0
  Filled 2023-11-03: qty 100, 100d supply, fill #1
  Filled 2024-02-14: qty 100, 100d supply, fill #2

## 2023-07-15 MED ORDER — INSULIN GLARGINE (1 UNIT DIAL) 300 UNIT/ML ~~LOC~~ SOPN: 4.0000 [IU] | PEN_INJECTOR | Freq: Every morning | SUBCUTANEOUS | 2 refills | Status: AC

## 2023-07-17 ENCOUNTER — Other Ambulatory Visit (HOSPITAL_COMMUNITY): Payer: Self-pay

## 2023-07-22 ENCOUNTER — Other Ambulatory Visit (HOSPITAL_COMMUNITY): Payer: Self-pay

## 2023-07-26 ENCOUNTER — Other Ambulatory Visit (HOSPITAL_COMMUNITY): Payer: Self-pay

## 2023-07-27 ENCOUNTER — Other Ambulatory Visit (HOSPITAL_COMMUNITY): Payer: Self-pay

## 2023-07-27 ENCOUNTER — Other Ambulatory Visit: Payer: Self-pay

## 2023-07-27 MED ORDER — LATANOPROST 0.005 % OP SOLN
1.0000 [drp] | Freq: Every day | OPHTHALMIC | 3 refills | Status: DC
  Filled 2023-07-27: qty 7.5, 75d supply, fill #0
  Filled 2023-07-27: qty 7.5, 90d supply, fill #0
  Filled 2023-10-22: qty 7.5, 75d supply, fill #1
  Filled 2023-12-07 – 2023-12-14 (×2): qty 7.5, 75d supply, fill #2
  Filled 2024-02-07 – 2024-02-18 (×2): qty 7.5, 75d supply, fill #3

## 2023-07-27 MED ORDER — DORZOLAMIDE HCL 2 % OP SOLN
1.0000 [drp] | Freq: Two times a day (BID) | OPHTHALMIC | 3 refills | Status: DC
  Filled 2023-07-27: qty 10, 50d supply, fill #0
  Filled 2023-07-27: qty 10, 90d supply, fill #0
  Filled 2023-08-25 – 2023-08-31 (×2): qty 10, 100d supply, fill #1
  Filled 2023-09-13: qty 10, 50d supply, fill #1
  Filled 2023-10-22: qty 10, 50d supply, fill #2
  Filled 2023-11-15 – 2023-12-07 (×2): qty 10, 50d supply, fill #3

## 2023-08-03 ENCOUNTER — Other Ambulatory Visit (HOSPITAL_COMMUNITY): Payer: Self-pay

## 2023-08-03 MED ORDER — FREESTYLE LIBRE 3 READER DEVI
0 refills | Status: AC
  Filled 2023-08-03: qty 1, 30d supply, fill #0

## 2023-08-03 MED ORDER — MOUNJARO 2.5 MG/0.5ML ~~LOC~~ SOAJ
2.5000 mg | SUBCUTANEOUS | 1 refills | Status: DC
  Filled 2023-08-03: qty 2, 28d supply, fill #0
  Filled 2023-08-25: qty 2, 28d supply, fill #1

## 2023-08-03 MED ORDER — FREESTYLE LIBRE 3 PLUS SENSOR MISC
11 refills | Status: AC
  Filled 2023-08-03: qty 2, 28d supply, fill #0
  Filled 2023-10-04: qty 2, 28d supply, fill #1
  Filled 2023-10-22 – 2023-10-25 (×2): qty 2, 28d supply, fill #2
  Filled 2023-11-16: qty 2, 30d supply, fill #3
  Filled 2023-12-16: qty 2, 30d supply, fill #4
  Filled 2024-01-24: qty 2, 30d supply, fill #5
  Filled 2024-03-01: qty 2, 30d supply, fill #6
  Filled 2024-04-03: qty 2, 30d supply, fill #7
  Filled 2024-06-17: qty 2, 30d supply, fill #8
  Filled 2024-07-05 – 2024-07-10 (×2): qty 2, 30d supply, fill #9

## 2023-08-03 MED ORDER — TOUJEO SOLOSTAR 300 UNIT/ML ~~LOC~~ SOPN
20.0000 [IU] | PEN_INJECTOR | Freq: Every day | SUBCUTANEOUS | 1 refills | Status: DC
  Filled 2023-08-03: qty 6, 90d supply, fill #0
  Filled 2023-12-06: qty 6, 90d supply, fill #1

## 2023-08-05 ENCOUNTER — Other Ambulatory Visit: Payer: Self-pay

## 2023-08-05 ENCOUNTER — Other Ambulatory Visit (HOSPITAL_COMMUNITY): Payer: Self-pay

## 2023-08-05 MED ORDER — LOSARTAN POTASSIUM 100 MG PO TABS
100.0000 mg | ORAL_TABLET | Freq: Every day | ORAL | 0 refills | Status: DC
  Filled 2023-08-05: qty 30, 30d supply, fill #0

## 2023-08-05 MED ORDER — PANTOPRAZOLE SODIUM 40 MG PO TBEC
40.0000 mg | DELAYED_RELEASE_TABLET | Freq: Two times a day (BID) | ORAL | 0 refills | Status: DC
  Filled 2023-08-05: qty 30, 15d supply, fill #0

## 2023-08-18 ENCOUNTER — Other Ambulatory Visit (HOSPITAL_COMMUNITY): Payer: Self-pay

## 2023-08-19 ENCOUNTER — Other Ambulatory Visit (HOSPITAL_COMMUNITY): Payer: Self-pay

## 2023-08-19 ENCOUNTER — Other Ambulatory Visit: Payer: Self-pay

## 2023-08-25 ENCOUNTER — Other Ambulatory Visit (HOSPITAL_COMMUNITY): Payer: Self-pay

## 2023-08-26 ENCOUNTER — Other Ambulatory Visit: Payer: Self-pay

## 2023-08-26 ENCOUNTER — Encounter: Payer: Self-pay | Admitting: Pharmacist

## 2023-08-27 ENCOUNTER — Other Ambulatory Visit: Payer: Self-pay

## 2023-08-31 ENCOUNTER — Other Ambulatory Visit (HOSPITAL_COMMUNITY): Payer: Self-pay

## 2023-08-31 ENCOUNTER — Other Ambulatory Visit: Payer: Self-pay

## 2023-09-01 ENCOUNTER — Other Ambulatory Visit: Payer: Self-pay

## 2023-09-06 ENCOUNTER — Other Ambulatory Visit: Payer: Self-pay

## 2023-09-13 ENCOUNTER — Other Ambulatory Visit: Payer: Self-pay

## 2023-09-13 ENCOUNTER — Other Ambulatory Visit (HOSPITAL_COMMUNITY): Payer: Self-pay

## 2023-09-16 ENCOUNTER — Other Ambulatory Visit: Payer: Self-pay

## 2023-09-27 ENCOUNTER — Other Ambulatory Visit (HOSPITAL_COMMUNITY): Payer: Self-pay

## 2023-09-29 ENCOUNTER — Other Ambulatory Visit (HOSPITAL_COMMUNITY): Payer: Self-pay

## 2023-09-29 MED ORDER — MOUNJARO 2.5 MG/0.5ML ~~LOC~~ SOAJ
2.5000 mg | SUBCUTANEOUS | 1 refills | Status: AC
  Filled 2023-09-29 – 2023-10-04 (×2): qty 2, 28d supply, fill #0

## 2023-10-04 ENCOUNTER — Other Ambulatory Visit (HOSPITAL_BASED_OUTPATIENT_CLINIC_OR_DEPARTMENT_OTHER): Payer: Self-pay

## 2023-10-04 ENCOUNTER — Other Ambulatory Visit (HOSPITAL_COMMUNITY): Payer: Self-pay

## 2023-10-19 ENCOUNTER — Other Ambulatory Visit (HOSPITAL_COMMUNITY): Payer: Self-pay

## 2023-10-19 MED ORDER — MOUNJARO 5 MG/0.5ML ~~LOC~~ SOAJ
5.0000 mg | SUBCUTANEOUS | 3 refills | Status: AC
  Filled 2023-10-19: qty 2, 28d supply, fill #0

## 2023-10-21 ENCOUNTER — Other Ambulatory Visit (HOSPITAL_COMMUNITY): Payer: Self-pay

## 2023-10-22 ENCOUNTER — Other Ambulatory Visit (HOSPITAL_COMMUNITY): Payer: Self-pay

## 2023-10-25 ENCOUNTER — Other Ambulatory Visit: Payer: Self-pay

## 2023-10-26 ENCOUNTER — Other Ambulatory Visit (HOSPITAL_COMMUNITY): Payer: Self-pay

## 2023-11-02 ENCOUNTER — Other Ambulatory Visit (HOSPITAL_COMMUNITY): Payer: Self-pay

## 2023-11-02 MED ORDER — MOUNJARO 7.5 MG/0.5ML ~~LOC~~ SOAJ
SUBCUTANEOUS | 3 refills | Status: DC
Start: 2023-11-02 — End: 2024-04-03
  Filled 2023-11-02: qty 2, 28d supply, fill #0
  Filled 2023-12-06: qty 2, 28d supply, fill #1
  Filled 2024-01-10: qty 2, 28d supply, fill #2
  Filled 2024-02-14: qty 2, 28d supply, fill #3

## 2023-11-02 MED ORDER — NOVOLOG FLEXPEN 100 UNIT/ML ~~LOC~~ SOPN
5.0000 [IU] | PEN_INJECTOR | Freq: Three times a day (TID) | SUBCUTANEOUS | 3 refills | Status: AC
  Filled 2023-11-02: qty 6, 40d supply, fill #0

## 2023-11-03 ENCOUNTER — Other Ambulatory Visit (HOSPITAL_COMMUNITY): Payer: Self-pay

## 2023-11-10 ENCOUNTER — Other Ambulatory Visit (HOSPITAL_BASED_OUTPATIENT_CLINIC_OR_DEPARTMENT_OTHER): Payer: Self-pay

## 2023-11-11 ENCOUNTER — Other Ambulatory Visit (HOSPITAL_COMMUNITY): Payer: Self-pay

## 2023-11-11 ENCOUNTER — Other Ambulatory Visit: Payer: Self-pay

## 2023-11-11 MED ORDER — METFORMIN HCL ER 500 MG PO TB24
1000.0000 mg | ORAL_TABLET | Freq: Two times a day (BID) | ORAL | 3 refills | Status: AC
  Filled 2023-11-11: qty 120, 30d supply, fill #0
  Filled 2024-01-10: qty 120, 30d supply, fill #1

## 2023-11-15 ENCOUNTER — Other Ambulatory Visit (HOSPITAL_COMMUNITY): Payer: Self-pay

## 2023-11-16 ENCOUNTER — Other Ambulatory Visit (HOSPITAL_COMMUNITY): Payer: Self-pay

## 2023-12-06 ENCOUNTER — Other Ambulatory Visit (HOSPITAL_COMMUNITY): Payer: Self-pay

## 2023-12-07 ENCOUNTER — Other Ambulatory Visit (HOSPITAL_COMMUNITY): Payer: Self-pay

## 2023-12-16 ENCOUNTER — Other Ambulatory Visit (HOSPITAL_COMMUNITY): Payer: Self-pay

## 2023-12-21 ENCOUNTER — Other Ambulatory Visit (HOSPITAL_COMMUNITY): Payer: Self-pay

## 2023-12-21 MED ORDER — PREDNISOLONE ACETATE 1 % OP SUSP
1.0000 [drp] | Freq: Four times a day (QID) | OPHTHALMIC | 1 refills | Status: AC
  Filled 2023-12-21: qty 5, 25d supply, fill #0

## 2023-12-21 MED ORDER — GATIFLOXACIN 0.5 % OP SOLN
1.0000 [drp] | Freq: Four times a day (QID) | OPHTHALMIC | 1 refills | Status: AC
  Filled 2023-12-21: qty 5, 25d supply, fill #0

## 2023-12-21 MED ORDER — KETOROLAC TROMETHAMINE 0.5 % OP SOLN
1.0000 [drp] | Freq: Four times a day (QID) | OPHTHALMIC | 1 refills | Status: AC
  Filled 2023-12-21: qty 10, 50d supply, fill #0

## 2024-01-10 ENCOUNTER — Other Ambulatory Visit: Payer: Self-pay

## 2024-01-10 ENCOUNTER — Other Ambulatory Visit (HOSPITAL_COMMUNITY): Payer: Self-pay

## 2024-01-11 ENCOUNTER — Other Ambulatory Visit (HOSPITAL_COMMUNITY): Payer: Self-pay

## 2024-01-24 ENCOUNTER — Other Ambulatory Visit (HOSPITAL_COMMUNITY): Payer: Self-pay

## 2024-02-07 ENCOUNTER — Other Ambulatory Visit (HOSPITAL_COMMUNITY): Payer: Self-pay

## 2024-02-14 ENCOUNTER — Other Ambulatory Visit (HOSPITAL_COMMUNITY): Payer: Self-pay

## 2024-02-18 ENCOUNTER — Other Ambulatory Visit (HOSPITAL_COMMUNITY): Payer: Self-pay

## 2024-02-28 ENCOUNTER — Other Ambulatory Visit: Payer: Self-pay

## 2024-02-28 ENCOUNTER — Other Ambulatory Visit (HOSPITAL_COMMUNITY): Payer: Self-pay

## 2024-02-28 MED ORDER — TOUJEO SOLOSTAR 300 UNIT/ML ~~LOC~~ SOPN
20.0000 [IU] | PEN_INJECTOR | Freq: Every day | SUBCUTANEOUS | 1 refills | Status: AC
  Filled 2024-02-28: qty 6, 90d supply, fill #0

## 2024-02-29 ENCOUNTER — Other Ambulatory Visit (HOSPITAL_COMMUNITY): Payer: Self-pay

## 2024-03-01 ENCOUNTER — Other Ambulatory Visit (HOSPITAL_COMMUNITY): Payer: Self-pay

## 2024-03-07 ENCOUNTER — Other Ambulatory Visit (HOSPITAL_COMMUNITY): Payer: Self-pay

## 2024-03-10 ENCOUNTER — Other Ambulatory Visit (HOSPITAL_COMMUNITY): Payer: Self-pay

## 2024-03-14 ENCOUNTER — Other Ambulatory Visit (HOSPITAL_COMMUNITY): Payer: Self-pay

## 2024-04-03 ENCOUNTER — Other Ambulatory Visit: Payer: Self-pay

## 2024-04-03 ENCOUNTER — Other Ambulatory Visit (HOSPITAL_COMMUNITY): Payer: Self-pay

## 2024-04-03 MED ORDER — MOUNJARO 7.5 MG/0.5ML ~~LOC~~ SOAJ
7.5000 mg | SUBCUTANEOUS | 0 refills | Status: DC
  Filled 2024-04-03: qty 2, 28d supply, fill #0

## 2024-04-05 ENCOUNTER — Other Ambulatory Visit (HOSPITAL_COMMUNITY): Payer: Self-pay

## 2024-04-06 ENCOUNTER — Other Ambulatory Visit (HOSPITAL_COMMUNITY): Payer: Self-pay

## 2024-04-07 ENCOUNTER — Other Ambulatory Visit (HOSPITAL_COMMUNITY): Payer: Self-pay

## 2024-04-07 ENCOUNTER — Other Ambulatory Visit: Payer: Self-pay

## 2024-04-07 MED ORDER — DORZOLAMIDE HCL 2 % OP SOLN
1.0000 [drp] | Freq: Two times a day (BID) | OPHTHALMIC | 3 refills | Status: AC
  Filled 2024-04-07: qty 10, 90d supply, fill #0

## 2024-04-07 MED ORDER — LATANOPROST 0.005 % OP SOLN
1.0000 [drp] | Freq: Every day | OPHTHALMIC | 3 refills | Status: AC
  Filled 2024-04-07 – 2024-04-12 (×2): qty 7.5, 75d supply, fill #0
  Filled 2024-07-05: qty 7.5, 75d supply, fill #1
  Filled 2024-07-05: qty 7.5, 75d supply, fill #0

## 2024-04-11 ENCOUNTER — Other Ambulatory Visit: Payer: Self-pay

## 2024-04-12 ENCOUNTER — Other Ambulatory Visit: Payer: Self-pay

## 2024-04-12 ENCOUNTER — Other Ambulatory Visit (HOSPITAL_COMMUNITY): Payer: Self-pay

## 2024-06-17 ENCOUNTER — Other Ambulatory Visit (HOSPITAL_BASED_OUTPATIENT_CLINIC_OR_DEPARTMENT_OTHER): Payer: Self-pay

## 2024-06-17 ENCOUNTER — Other Ambulatory Visit (HOSPITAL_COMMUNITY): Payer: Self-pay

## 2024-06-26 ENCOUNTER — Other Ambulatory Visit (HOSPITAL_COMMUNITY): Payer: Self-pay

## 2024-07-05 ENCOUNTER — Other Ambulatory Visit (HOSPITAL_COMMUNITY): Payer: Self-pay

## 2024-07-05 ENCOUNTER — Other Ambulatory Visit (HOSPITAL_BASED_OUTPATIENT_CLINIC_OR_DEPARTMENT_OTHER): Payer: Self-pay

## 2024-07-10 ENCOUNTER — Other Ambulatory Visit (HOSPITAL_COMMUNITY): Payer: Self-pay
